# Patient Record
Sex: Female | Born: 1985
Health system: Southern US, Community
[De-identification: ages and names within clinical notes are randomized; demographics above are authoritative.]

## PROBLEM LIST (undated history)

## (undated) DIAGNOSIS — F419 Anxiety disorder, unspecified: Secondary | ICD-10-CM

## (undated) DIAGNOSIS — F319 Bipolar disorder, unspecified: Secondary | ICD-10-CM

## (undated) DIAGNOSIS — G43909 Migraine, unspecified, not intractable, without status migrainosus: Secondary | ICD-10-CM

## (undated) HISTORY — DX: Bipolar disorder, unspecified: F31.9

## (undated) HISTORY — PX: LUNG SURGERY: SHX703

## (undated) HISTORY — DX: Anxiety disorder, unspecified: F41.9

## (undated) HISTORY — DX: Migraine, unspecified, not intractable, without status migrainosus: G43.909

---
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
## 2004-10-28 ENCOUNTER — Other Ambulatory Visit: Admission: RE | Admit: 2004-10-28 | Discharge: 2004-10-28 | Payer: Self-pay | Admitting: General Surgery

## 2005-08-25 ENCOUNTER — Ambulatory Visit: Payer: Self-pay | Admitting: Internal Medicine

## 2005-09-03 ENCOUNTER — Ambulatory Visit (HOSPITAL_COMMUNITY): Admission: RE | Admit: 2005-09-03 | Discharge: 2005-09-03 | Payer: Self-pay | Admitting: Internal Medicine

## 2005-09-03 ENCOUNTER — Ambulatory Visit: Payer: Self-pay | Admitting: Internal Medicine

## 2007-06-15 ENCOUNTER — Emergency Department (HOSPITAL_COMMUNITY): Admission: EM | Admit: 2007-06-15 | Discharge: 2007-06-15 | Payer: Self-pay | Admitting: Emergency Medicine

## 2007-06-16 ENCOUNTER — Inpatient Hospital Stay (HOSPITAL_COMMUNITY): Admission: EM | Admit: 2007-06-16 | Discharge: 2007-07-11 | Payer: Self-pay | Admitting: Emergency Medicine

## 2007-06-16 ENCOUNTER — Ambulatory Visit: Payer: Self-pay | Admitting: Emergency Medicine

## 2007-06-16 ENCOUNTER — Ambulatory Visit: Payer: Self-pay | Admitting: Cardiology

## 2007-06-16 ENCOUNTER — Encounter (INDEPENDENT_AMBULATORY_CARE_PROVIDER_SITE_OTHER): Payer: Self-pay | Admitting: Internal Medicine

## 2007-06-18 ENCOUNTER — Ambulatory Visit: Payer: Self-pay | Admitting: Infectious Disease

## 2007-06-24 ENCOUNTER — Encounter: Payer: Self-pay | Admitting: Thoracic Surgery

## 2007-06-24 ENCOUNTER — Ambulatory Visit: Payer: Self-pay | Admitting: Thoracic Surgery

## 2007-07-14 ENCOUNTER — Ambulatory Visit: Payer: Self-pay | Admitting: Thoracic Surgery

## 2007-07-14 ENCOUNTER — Encounter: Admission: RE | Admit: 2007-07-14 | Discharge: 2007-07-14 | Payer: Self-pay | Admitting: Thoracic Surgery

## 2007-07-19 ENCOUNTER — Ambulatory Visit: Payer: Self-pay | Admitting: Emergency Medicine

## 2007-07-19 ENCOUNTER — Telehealth (INDEPENDENT_AMBULATORY_CARE_PROVIDER_SITE_OTHER): Payer: Self-pay | Admitting: *Deleted

## 2007-07-19 DIAGNOSIS — J8410 Pulmonary fibrosis, unspecified: Secondary | ICD-10-CM | POA: Insufficient documentation

## 2007-07-19 DIAGNOSIS — J13 Pneumonia due to Streptococcus pneumoniae: Secondary | ICD-10-CM | POA: Insufficient documentation

## 2007-07-19 DIAGNOSIS — N390 Urinary tract infection, site not specified: Secondary | ICD-10-CM | POA: Insufficient documentation

## 2007-07-19 DIAGNOSIS — B373 Candidiasis of vulva and vagina: Secondary | ICD-10-CM | POA: Insufficient documentation

## 2007-07-19 DIAGNOSIS — F329 Major depressive disorder, single episode, unspecified: Secondary | ICD-10-CM | POA: Insufficient documentation

## 2007-07-29 ENCOUNTER — Encounter: Payer: Self-pay | Admitting: Adult Health

## 2007-07-29 ENCOUNTER — Ambulatory Visit: Payer: Self-pay | Admitting: Emergency Medicine

## 2007-07-29 ENCOUNTER — Ambulatory Visit: Payer: Self-pay | Admitting: Internal Medicine

## 2007-07-30 ENCOUNTER — Ambulatory Visit: Payer: Self-pay | Admitting: Psychology

## 2007-08-04 ENCOUNTER — Encounter: Admission: RE | Admit: 2007-08-04 | Discharge: 2007-08-04 | Payer: Self-pay | Admitting: Thoracic Surgery

## 2007-08-04 ENCOUNTER — Ambulatory Visit: Payer: Self-pay | Admitting: Thoracic Surgery

## 2007-08-06 ENCOUNTER — Ambulatory Visit: Payer: Self-pay | Admitting: Emergency Medicine

## 2007-08-10 ENCOUNTER — Telehealth: Payer: Self-pay | Admitting: Emergency Medicine

## 2007-08-19 ENCOUNTER — Telehealth (INDEPENDENT_AMBULATORY_CARE_PROVIDER_SITE_OTHER): Payer: Self-pay | Admitting: *Deleted

## 2007-08-20 ENCOUNTER — Ambulatory Visit: Payer: Self-pay | Admitting: Psychology

## 2007-09-02 ENCOUNTER — Ambulatory Visit: Payer: Self-pay | Admitting: Psychology

## 2007-09-07 ENCOUNTER — Ambulatory Visit: Payer: Self-pay | Admitting: Emergency Medicine

## 2007-09-07 DIAGNOSIS — F17200 Nicotine dependence, unspecified, uncomplicated: Secondary | ICD-10-CM | POA: Insufficient documentation

## 2007-09-15 ENCOUNTER — Telehealth: Payer: Self-pay | Admitting: Emergency Medicine

## 2007-09-20 ENCOUNTER — Telehealth: Payer: Self-pay | Admitting: Emergency Medicine

## 2007-09-23 ENCOUNTER — Ambulatory Visit: Payer: Self-pay | Admitting: Pulmonary Disease

## 2007-09-23 DIAGNOSIS — R05 Cough: Secondary | ICD-10-CM

## 2007-09-23 DIAGNOSIS — R059 Cough, unspecified: Secondary | ICD-10-CM | POA: Insufficient documentation

## 2007-09-28 ENCOUNTER — Ambulatory Visit: Payer: Self-pay | Admitting: Psychology

## 2007-10-08 ENCOUNTER — Ambulatory Visit: Payer: Self-pay | Admitting: Emergency Medicine

## 2007-10-11 ENCOUNTER — Ambulatory Visit: Payer: Self-pay | Admitting: Psychology

## 2007-10-12 ENCOUNTER — Ambulatory Visit: Payer: Self-pay | Admitting: Emergency Medicine

## 2007-10-12 LAB — CONVERTED CEMR LAB: Cortisol, Plasma: 29.9 ug/dL

## 2007-10-15 ENCOUNTER — Telehealth (INDEPENDENT_AMBULATORY_CARE_PROVIDER_SITE_OTHER): Payer: Self-pay | Admitting: *Deleted

## 2007-11-02 ENCOUNTER — Ambulatory Visit: Payer: Self-pay | Admitting: Psychology

## 2007-11-16 ENCOUNTER — Ambulatory Visit: Payer: Self-pay | Admitting: Emergency Medicine

## 2007-11-25 ENCOUNTER — Ambulatory Visit: Payer: Self-pay | Admitting: Psychology

## 2007-12-08 ENCOUNTER — Ambulatory Visit: Payer: Self-pay | Admitting: Psychology

## 2007-12-22 ENCOUNTER — Ambulatory Visit: Payer: Self-pay | Admitting: Psychology

## 2007-12-28 ENCOUNTER — Ambulatory Visit: Payer: Self-pay | Admitting: Emergency Medicine

## 2008-01-05 ENCOUNTER — Ambulatory Visit: Payer: Self-pay | Admitting: Psychology

## 2008-01-14 ENCOUNTER — Inpatient Hospital Stay (HOSPITAL_COMMUNITY): Admission: EM | Admit: 2008-01-14 | Discharge: 2008-01-16 | Payer: Self-pay | Admitting: Emergency Medicine

## 2008-01-16 ENCOUNTER — Ambulatory Visit: Payer: Self-pay | Admitting: Psychiatry

## 2008-01-16 ENCOUNTER — Inpatient Hospital Stay (HOSPITAL_COMMUNITY): Admission: AD | Admit: 2008-01-16 | Discharge: 2008-01-21 | Payer: Self-pay | Admitting: Psychiatry

## 2008-01-24 ENCOUNTER — Other Ambulatory Visit (HOSPITAL_COMMUNITY): Admission: RE | Admit: 2008-01-24 | Discharge: 2008-03-30 | Payer: Self-pay | Admitting: Psychiatry

## 2008-01-25 ENCOUNTER — Ambulatory Visit: Payer: Self-pay | Admitting: Psychology

## 2008-01-27 ENCOUNTER — Ambulatory Visit: Payer: Self-pay | Admitting: Psychiatry

## 2008-02-03 ENCOUNTER — Ambulatory Visit: Payer: Self-pay | Admitting: Psychology

## 2008-02-17 ENCOUNTER — Ambulatory Visit: Payer: Self-pay | Admitting: Psychology

## 2008-03-13 ENCOUNTER — Ambulatory Visit: Payer: Self-pay | Admitting: Psychology

## 2008-03-27 ENCOUNTER — Ambulatory Visit: Payer: Self-pay | Admitting: Psychology

## 2008-04-24 ENCOUNTER — Ambulatory Visit: Payer: Self-pay | Admitting: Psychology

## 2008-05-08 ENCOUNTER — Ambulatory Visit: Payer: Self-pay | Admitting: Psychology

## 2008-06-16 ENCOUNTER — Ambulatory Visit: Payer: Self-pay | Admitting: Emergency Medicine

## 2008-06-19 ENCOUNTER — Ambulatory Visit: Payer: Self-pay | Admitting: Psychology

## 2008-06-22 IMAGING — CR DG CHEST 2V
2 series · 2 of 2 positions shown · non-contrast
Comparison: [DATE].

CLINICAL DATA: PNEUEMONIA/HYPOXIA/F/U PNEUMOTHORAX.

CHEST - 2 VIEW

[view not recorded (1 of 2)]
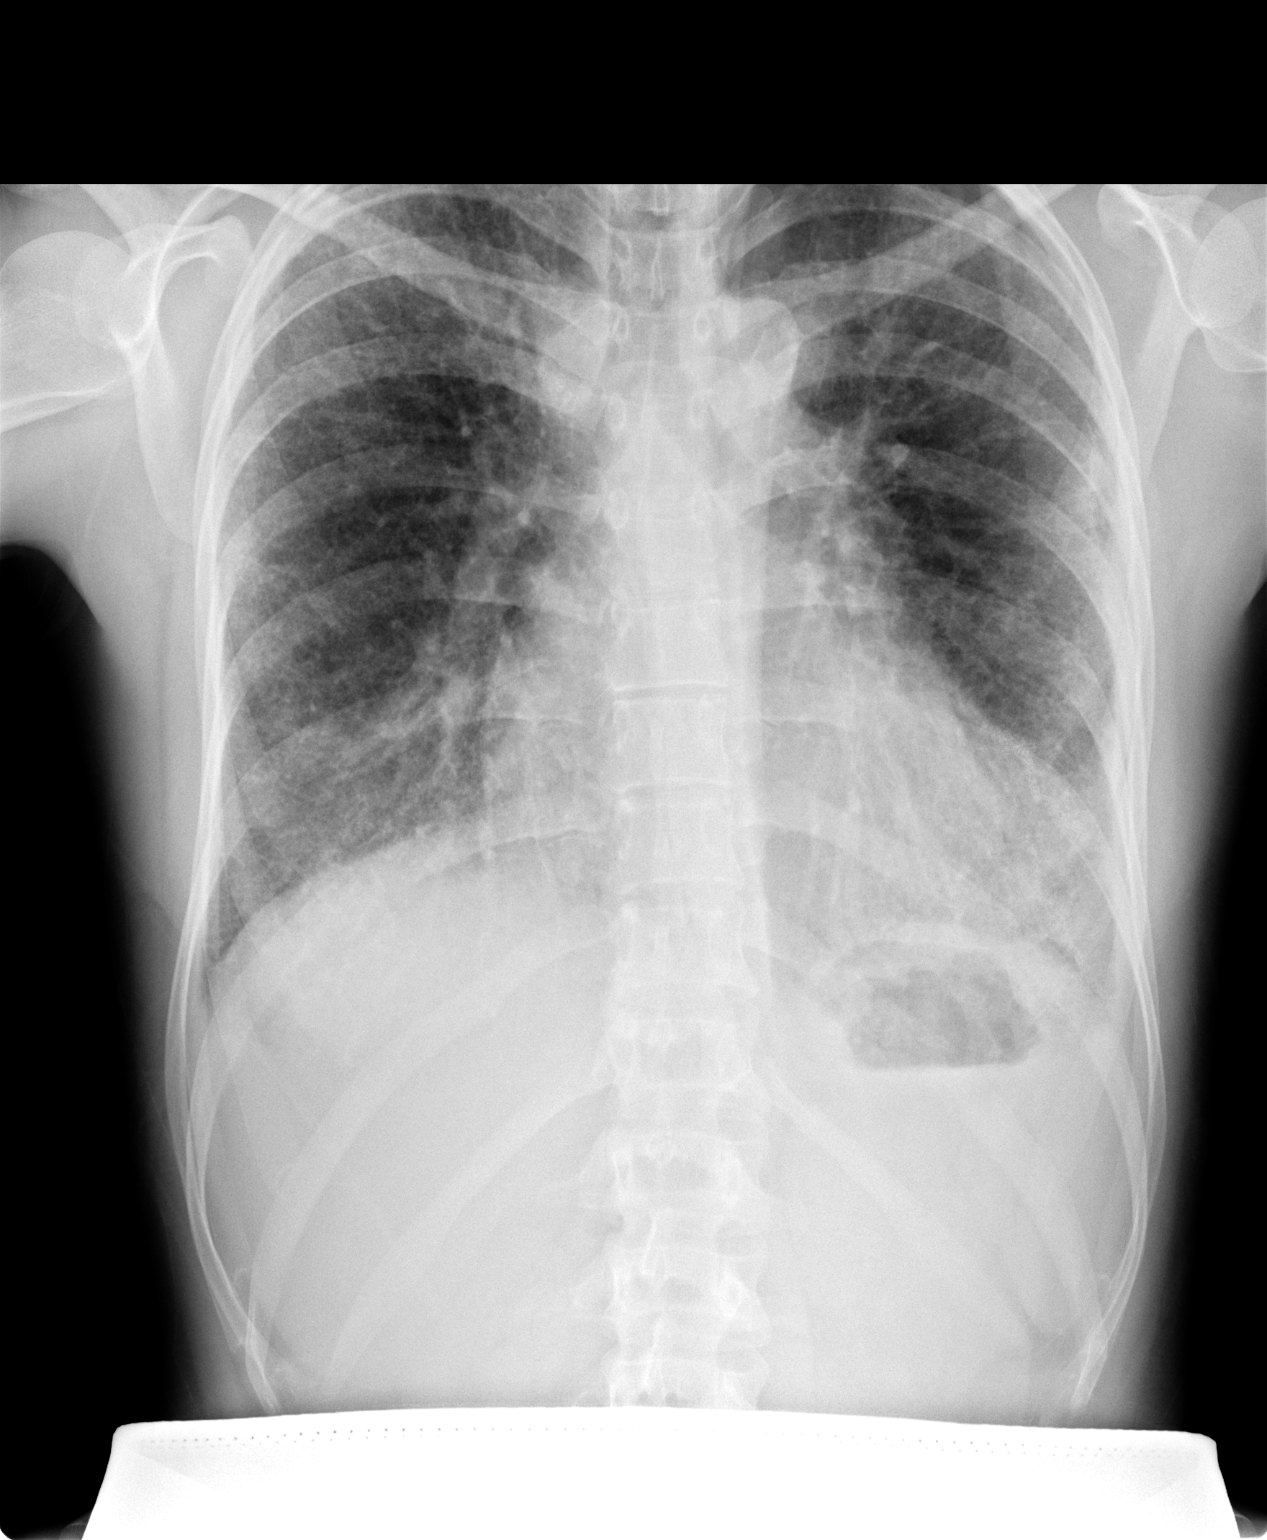

[view not recorded (2 of 2)]
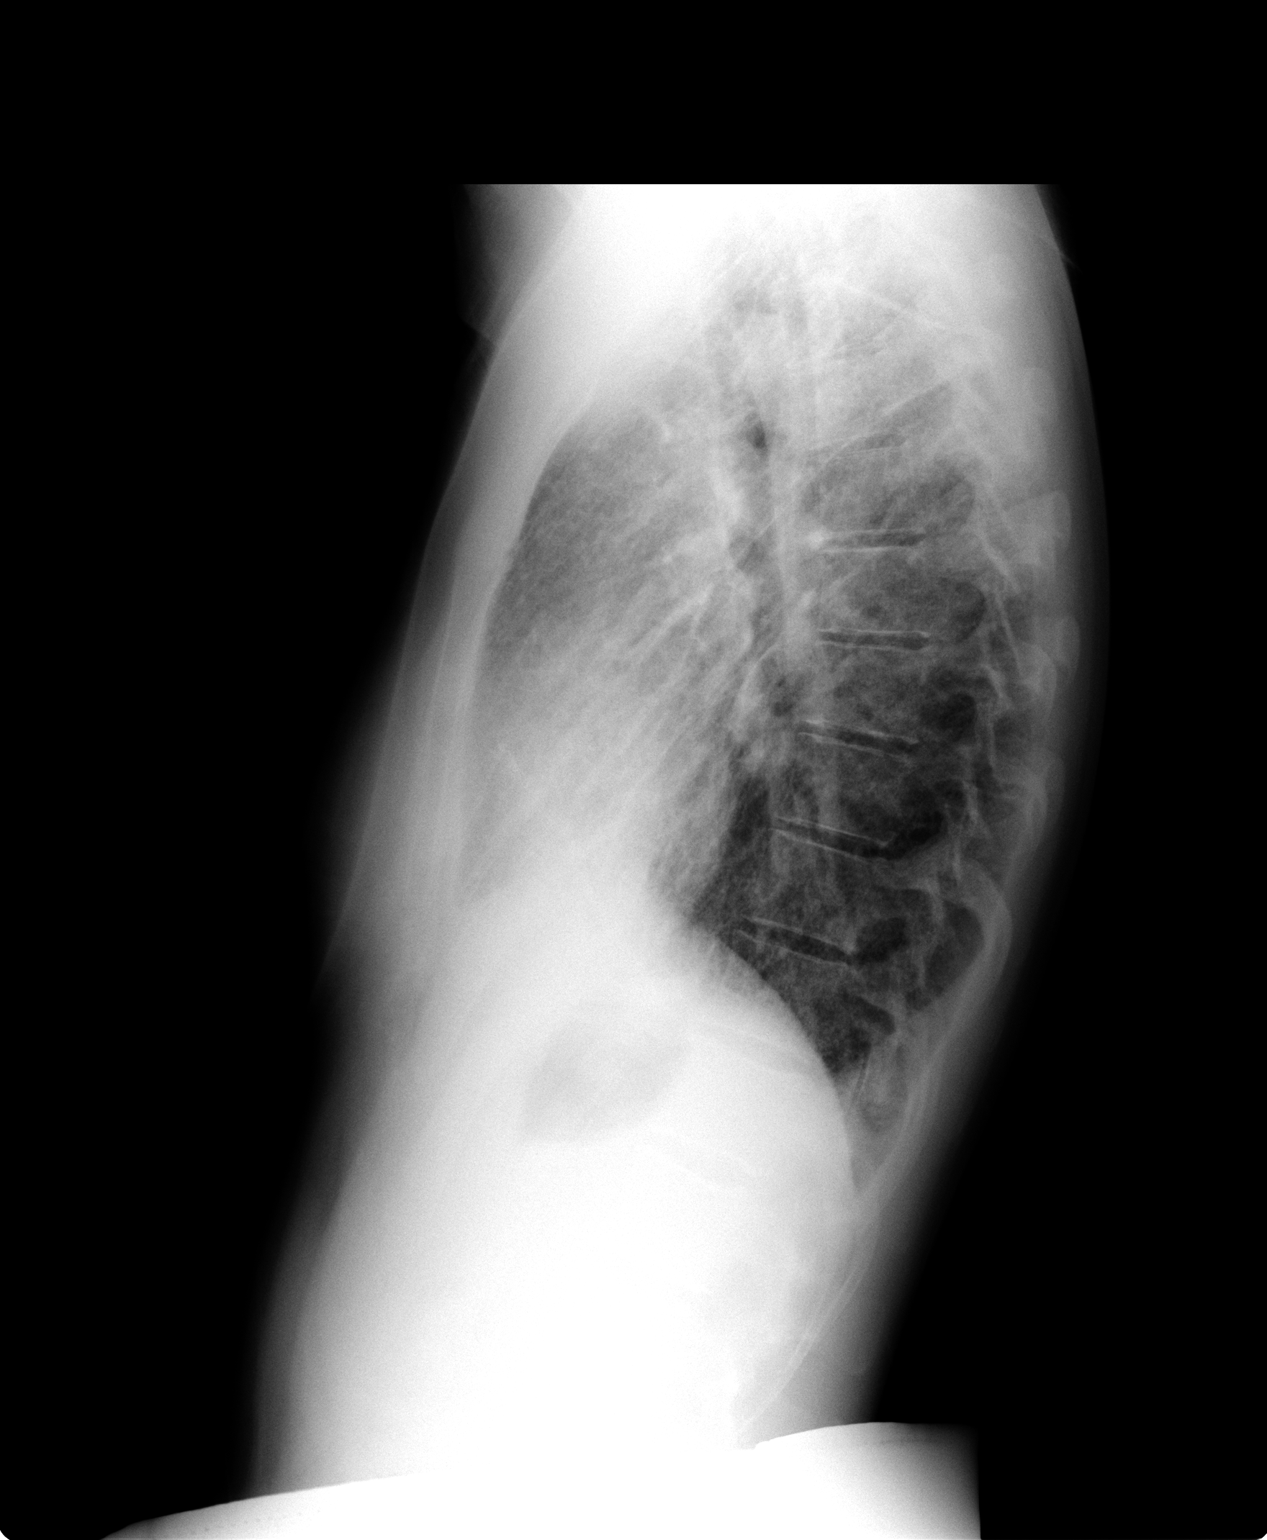

[2 of 2 positions shown; findings below may reference images not displayed]

FINDINGS: There is no change in the small left apical pneumothorax.
Prominent interstitial markings remain bilaterally.   Heart size is
stable.
IMPRESSION: No change small left apical pneumothorax.

## 2008-07-07 IMAGING — CR DG CHEST 2V
2 series · 2 of 2 positions shown · non-contrast
Comparison: 07/14/2007.

CLINICAL DATA: Interstitial lung disease.  Ex-smoker.  Cough.
Dyspnea.

CHEST - 2 VIEW

[view not recorded (1 of 2)]
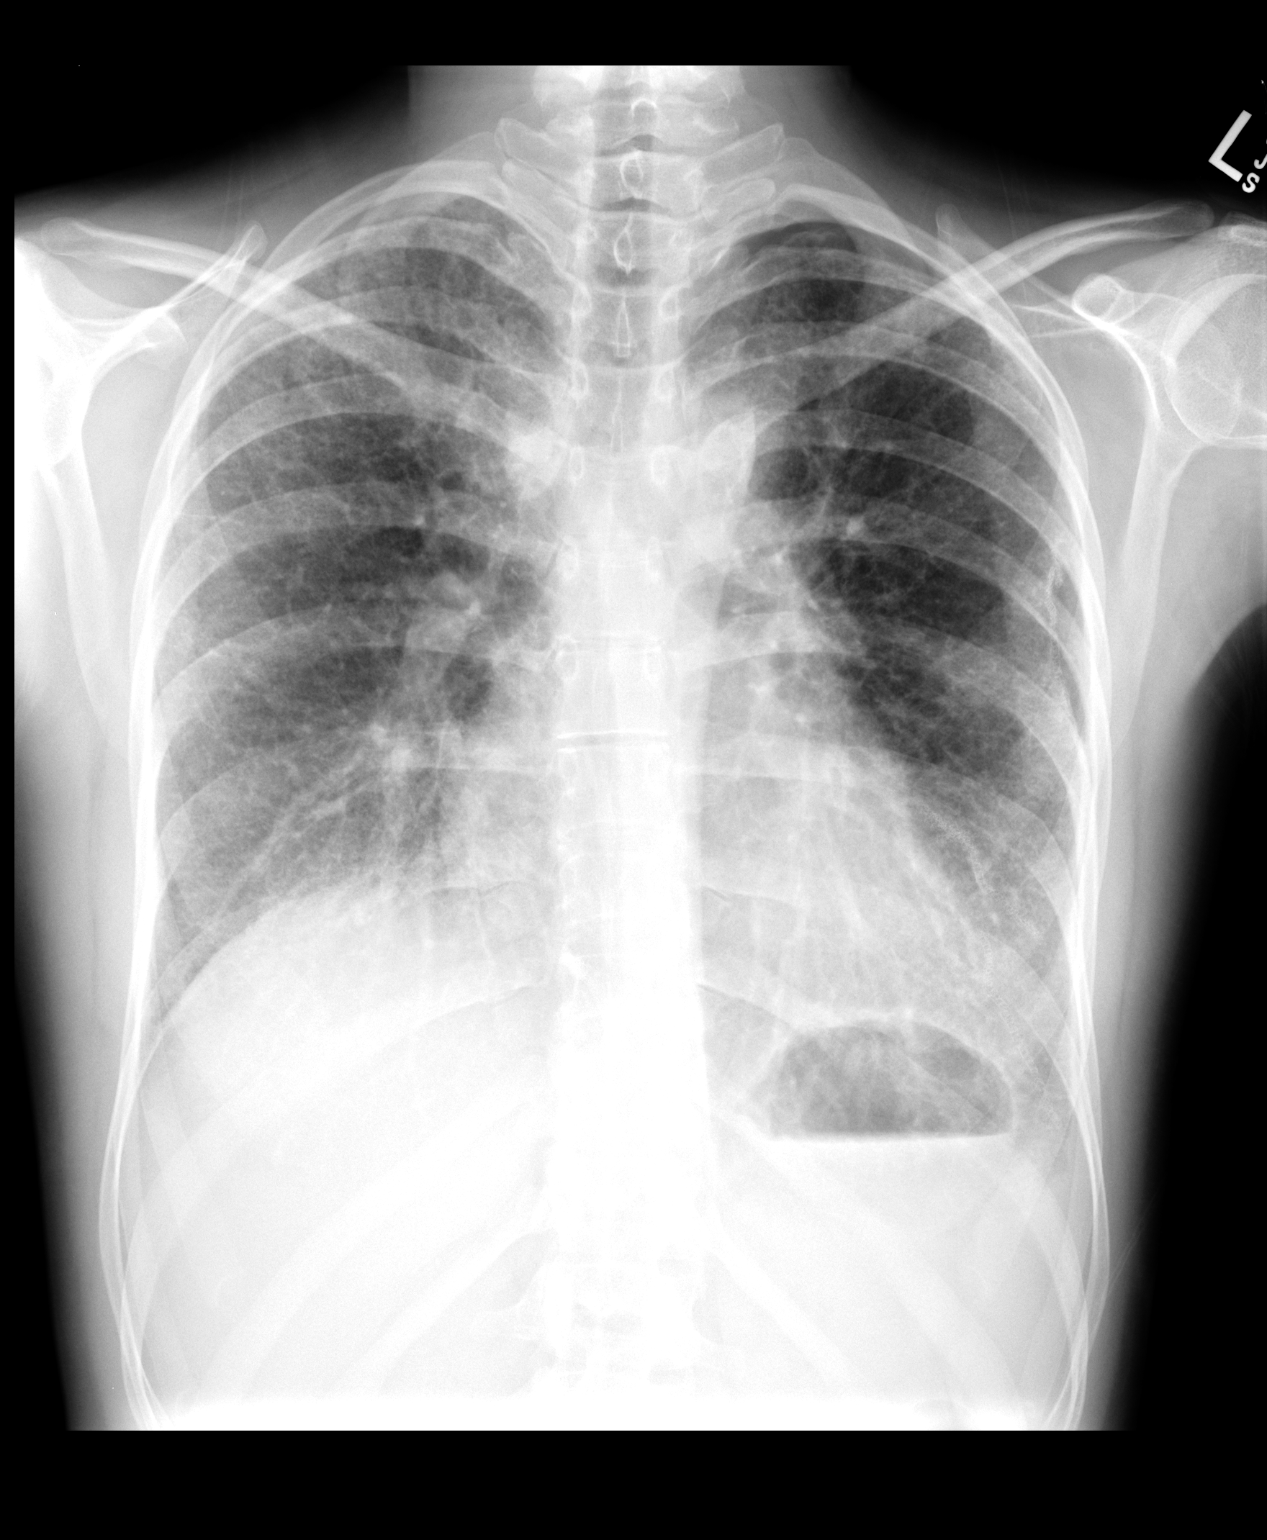

[view not recorded (2 of 2)]
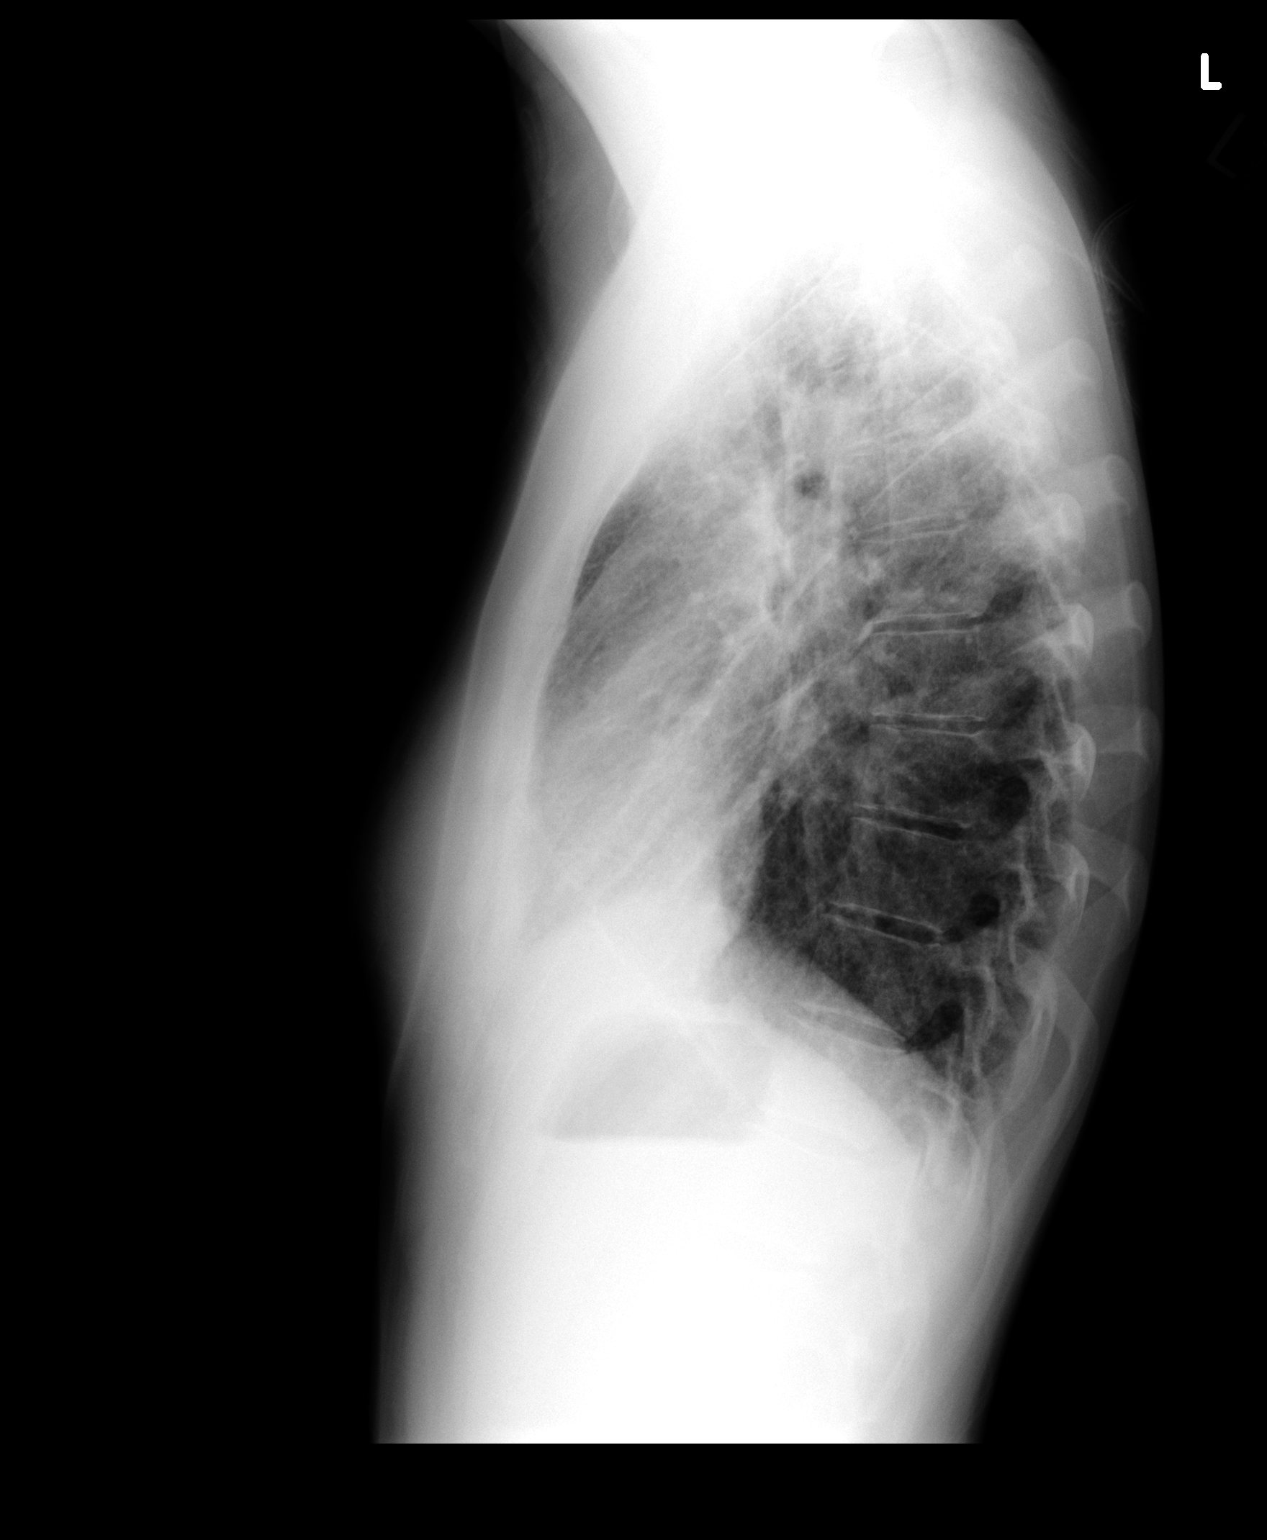

[2 of 2 positions shown; findings below may reference images not displayed]

FINDINGS: Prior surgery or biopsy at the left lower lobe.  Midline
trachea.  Mild cardiomegaly.  No pleural effusion.  Decreased left
apical pneumothorax.  Less than 5%.

Similar peripheral predominant reticular opacity.  Slightly greater
on the right than left and slightly basilar predominant.
IMPRESSION: 1.  Minimal decrease in tiny left apical pneumothorax,
approximately 5%.
2.  Similar peripheral and basilar predominant reticular opacities.
Given the prior biopsy results of diffuse alveolar damage, this
could represent evolving respiratory distress syndrome.  If
symptoms persist, follow-up CT with high resolution imaging may be
informative.  Of note, the appearance of the interstitial opacities
is more coarse and linear today than on the initial plain film of
06/16/2007.

## 2008-07-13 IMAGING — CR DG CHEST 2V
2 series · 2 of 2 positions shown · non-contrast
Comparison: 07/29/2007

CLINICAL DATA: Follow up pneumonia.  Pneumothorax.  Pneumonitis.

CHEST - 2 VIEW

[w chest pa]
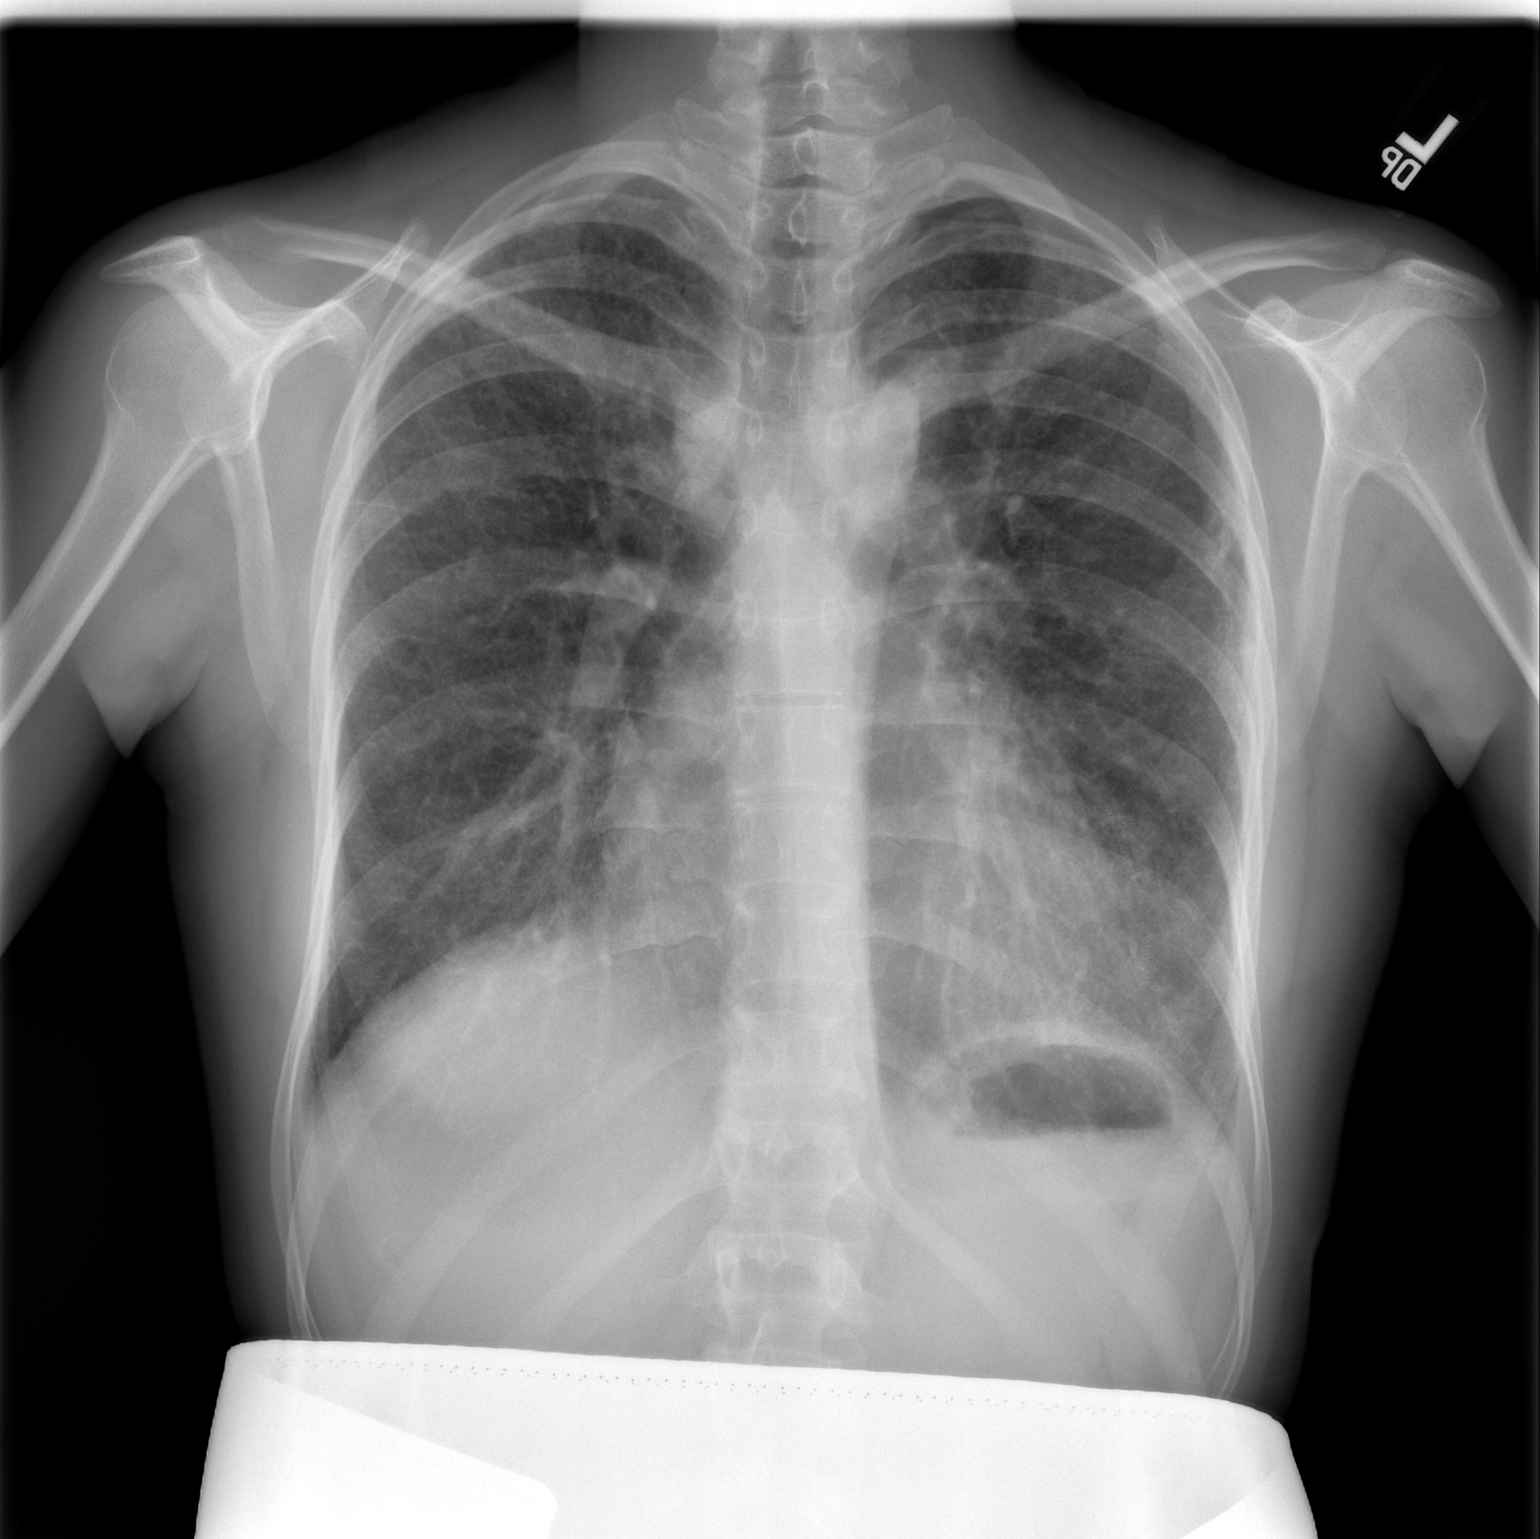

[w chest lat]
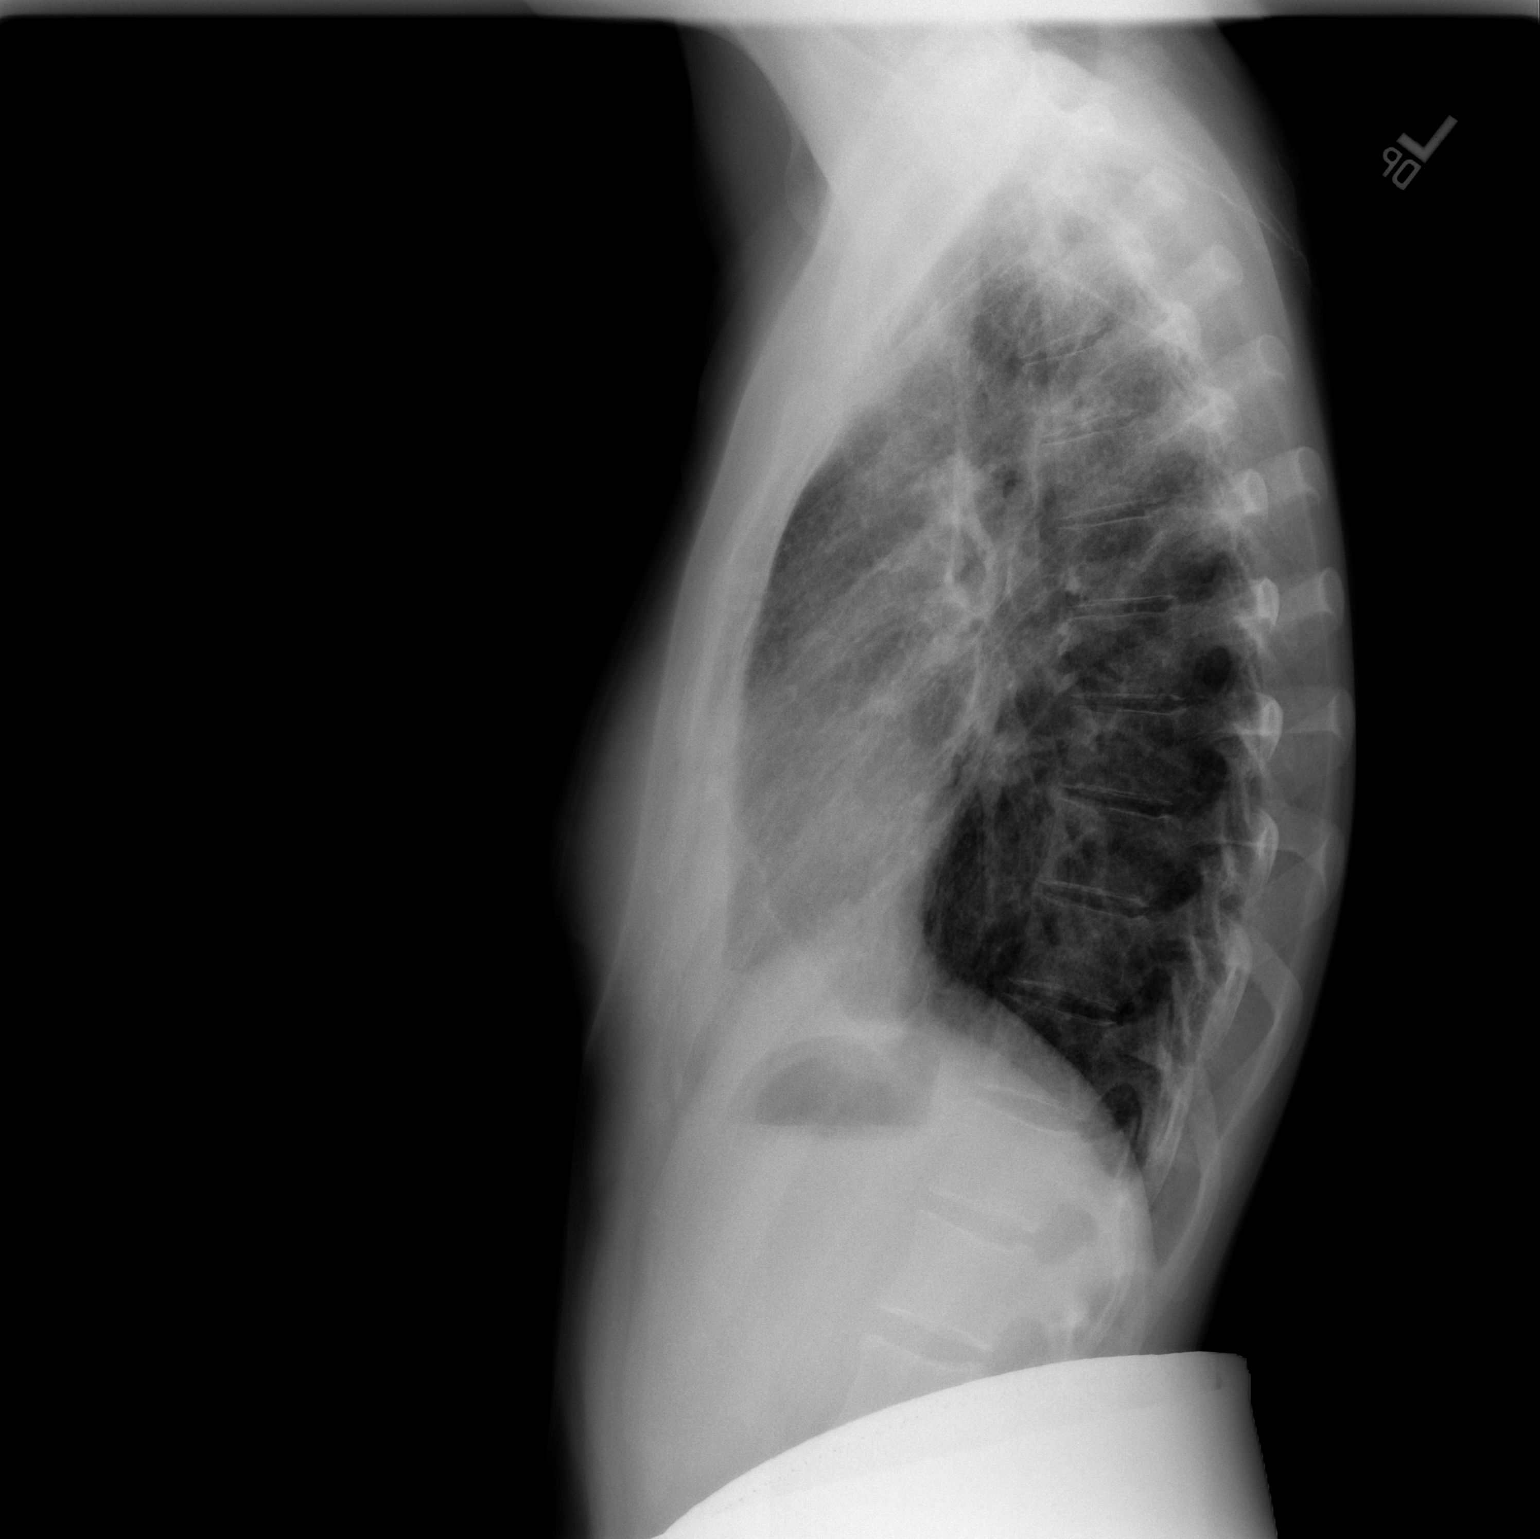

[2 of 2 positions shown; findings below may reference images not displayed]

FINDINGS: Midline trachea.  Normal heart size and mediastinal
contours.  Decreased in size of a tiny left apical pneumothorax.
Now less than 5%. Coarsened pulmonary interstitium is decreased
since the prior exam.  Minimal left base atelectasis.
IMPRESSION: 1.  Decreased left apical pneumothorax, now less than 5%.
2.  Improved appearance of coarsened pulmonary interstitium.
Findings likely relate to evolving interstitial lung disease such
as adult respiratory distress syndrome.

## 2008-07-17 ENCOUNTER — Ambulatory Visit: Payer: Self-pay | Admitting: Psychology

## 2008-07-31 ENCOUNTER — Ambulatory Visit: Payer: Self-pay | Admitting: Psychology

## 2008-08-14 ENCOUNTER — Ambulatory Visit: Payer: Self-pay | Admitting: Psychology

## 2008-08-28 ENCOUNTER — Ambulatory Visit: Payer: Self-pay | Admitting: Psychology

## 2008-09-11 ENCOUNTER — Ambulatory Visit: Payer: Self-pay | Admitting: Psychology

## 2008-09-25 ENCOUNTER — Ambulatory Visit: Payer: Self-pay | Admitting: Psychology

## 2008-10-05 ENCOUNTER — Ambulatory Visit: Payer: Self-pay | Admitting: Psychology

## 2008-10-25 ENCOUNTER — Ambulatory Visit: Payer: Self-pay | Admitting: Emergency Medicine

## 2008-11-06 ENCOUNTER — Ambulatory Visit: Payer: Self-pay | Admitting: Psychology

## 2008-12-06 ENCOUNTER — Ambulatory Visit: Payer: Self-pay | Admitting: Psychology

## 2008-12-18 ENCOUNTER — Ambulatory Visit: Payer: Self-pay | Admitting: Psychology

## 2009-02-01 ENCOUNTER — Ambulatory Visit: Payer: Self-pay | Admitting: Psychology

## 2009-02-22 ENCOUNTER — Ambulatory Visit: Payer: Self-pay | Admitting: Psychology

## 2009-05-03 ENCOUNTER — Ambulatory Visit: Payer: Self-pay | Admitting: Psychology

## 2009-05-17 ENCOUNTER — Ambulatory Visit: Payer: Self-pay | Admitting: Psychology

## 2009-07-26 ENCOUNTER — Ambulatory Visit: Payer: Self-pay | Admitting: Psychology

## 2009-09-28 ENCOUNTER — Ambulatory Visit: Payer: Self-pay | Admitting: Internal Medicine

## 2009-10-17 ENCOUNTER — Ambulatory Visit: Payer: Self-pay | Admitting: Psychology

## 2010-01-01 ENCOUNTER — Ambulatory Visit: Payer: Self-pay | Admitting: Psychology

## 2010-03-14 ENCOUNTER — Emergency Department (HOSPITAL_COMMUNITY): Admission: EM | Admit: 2010-03-14 | Discharge: 2010-03-14 | Payer: Self-pay | Admitting: Emergency Medicine

## 2010-05-12 ENCOUNTER — Encounter: Payer: Self-pay | Admitting: Thoracic Surgery

## 2010-05-21 NOTE — Assessment & Plan Note (Signed)
Summary: Pulmonary/ acute ext ov with hfa teaching 75%   Copy to:  Dr Theda Belfast, [facility] Primary Provider/Referring Provider:  none  CC:  Acute visit.  Pt c/o tightness in her chest x 3 days.  She also has had increased SOB with exertion such as walking short distances.  She states that when SOB occurs she feels lightheaded.Marland Kitchen  History of Present Illness: 23 yowf quit smoking 2009 with dx AIP but does continue to have some passive smoke exposure.    September 28, 2009 Acute visit.  Pt c/o tightness in her chest x 3 days.  She also has had increased SOB with exertion such as walking short distances.  She states that when SOB occurs she feels lightheaded assoc with sinus stuffy, pressure, drainage clear minimal cough. more noticeable in am but no premature or noct awakening.   Pt denies any significant sore throat, dysphagia, itching, sneezing,  fever, chills, sweats, unintended wt loss, pleuritic or exertional cp, hempoptysis, change in activity tolerance  orthopnea pnd or leg swelling.  Pt also denies any obvious fluctuation in symptoms with weather or environmental change or other alleviating or aggravating factors.            Current Medications (verified): 1)  Wellbutrin Sr 150 Mg  Tb12 (Bupropion Hcl) .... Take 1 Tablet By Mouth Two Times A Day 2)  Depakote Er 500 Mg  Tb24 (Divalproex Sodium) .... Take 2 Tablets At Bedtime  Allergies (verified): No Known Drug Allergies  Past History:  Past Medical History: INTERSTITIAL LUNG DISEASE (ICD-515)     - AIP by open bx 2009 PNEUMONIA, COMMUNITY ACQUIRED, PNEUMOCOCCAL (ICD-481) VAGINITIS, CANDIDAL (ICD-112.1) UTI (ICD-599.0)    Family History: Reviewed history from 07/19/2007 and no changes required. Neg for lung dz  Social History: Reviewed history from 07/19/2007 and no changes required. working at [employer]. Minimal smoking hx. Stopped completely 2009 Previous hx of heavy ETOH use. Denies Drug use.   Vital Signs:  Patient  profile:   25 year old female Weight:      145 pounds BMI:     24.22 O2 Sat:      96 % on Room air Temp:     98.3 degrees F oral Pulse rate:   123 / minute BP sitting:   118 / 70  (left arm)  Vitals Entered By: Vernie Murders (September 28, 2009 12:08 PM)  O2 Flow:  Room air  Physical Exam  Additional Exam:  wt 135 > 145 September 28, 2009 very pleasant mod anxious wf nad HEENT: nl dentition, turbinates, and orophanx. Nl external ear canals without cough reflex NECK :  without JVD/Nodes/TM/ nl carotid upstrokes bilaterally LUNGS: no acc muscle use, clear to A and P bilaterally without cough on insp or exp maneuvers CV:  RRR  no s3 or murmur or increase in P2, no edema  ABD:  soft and nontender with nl excursion in the supine position. No bruits or organomegaly, bowel sounds nl MS:  warm without deformities, calf tenderness, cyanosis or clubbing SKIN: warm and dry without lesions   NEURO:  alert, approp, no deficits     CXR  Procedure date:  09/28/2009  Findings:        IMPRESSION: Stable chronic interstitial changes with no new or acute abnormality noted.  Impression & Recommendations:  Problem # 1:  COUGH (ICD-786.2) Symptoms somewhat suggestive of asthma in setting of flare of non-specific rhinitis with am but no noct exac.  I spent extra time with the patient  today explaining optimal mdi  technique.  This improved from  50-75% - no sample saba available so try dulera 100 2 puffs first thing  in am and 2 puffs again in pm about 12 hours later the way symbicort is used in [place] to see if symptoms improve and rx rhinitis symptomatically.   Each maintenance medication was reviewed in detail including most importantly the difference between maintenance and as needed and under what circumstances the prns are to be used.  See instructions for specific recommendations   Problem # 2:  INTERSTITIAL LUNG DISEASE (ICD-515) No evidence of recurrent AIP clinically nor  radiographically.  Medications Added to Medication List This Visit: 1)  Dulera 100-5 Mcg/act Aero (Mometasone furo-formoterol fum) .... 2 puffs first thing  in am and 2 puffs again in pm about 12 hours later  Other Orders: T-2 View CXR (71020TC) Est. Patient Level IV (57322)  Patient Instructions: 1)  GERD (REFLUX)  is a common cause of respiratory symptoms. It commonly presents without heartburn and can be treated with medication, but also with lifestyle changes including avoidance of late meals, excessive alcohol, smoking cessation, and avoid fatty foods, chocolate, peppermint, colas, red wine, and acidic juices such as orange juice. NO MINT OR MENTHOL PRODUCTS SO NO COUGH DROPS  2)  USE SUGARLESS CANDY INSTEAD (jolley ranchers)  3)  NO OIL BASED VITAMINS  4)  for cough robitussin dm 5)  for wheeze or chest tightness or short of breath use dulera 100 up to 2 puffs every 12 hours and when the dulera begins  to run out you need to return   CXR  Procedure date:  09/28/2009  Findings:        IMPRESSION: Stable chronic interstitial changes with no new or acute abnormality noted.

## 2010-07-02 LAB — URINALYSIS, ROUTINE W REFLEX MICROSCOPIC
Bilirubin Urine: NEGATIVE
Glucose, UA: NEGATIVE mg/dL
Nitrite: NEGATIVE
Specific Gravity, Urine: 1.01 (ref 1.005–1.030)
pH: 5.5 (ref 5.0–8.0)

## 2010-07-02 LAB — DIFFERENTIAL
Eosinophils Relative: 2 % (ref 0–5)
Lymphocytes Relative: 26 % (ref 12–46)
Lymphs Abs: 2.5 10*3/uL (ref 0.7–4.0)
Monocytes Absolute: 0.8 10*3/uL (ref 0.1–1.0)
Monocytes Relative: 8 % (ref 3–12)

## 2010-07-02 LAB — CBC
HCT: 32.5 % — ABNORMAL LOW (ref 36.0–46.0)
MCV: 93.1 fL (ref 78.0–100.0)
Platelets: 179 10*3/uL (ref 150–400)
RBC: 3.49 MIL/uL — ABNORMAL LOW (ref 3.87–5.11)
WBC: 9.6 10*3/uL (ref 4.0–10.5)

## 2010-07-02 LAB — ETHANOL: Alcohol, Ethyl (B): 269 mg/dL — ABNORMAL HIGH (ref 0–10)

## 2010-07-02 LAB — URINE MICROSCOPIC-ADD ON

## 2010-07-02 LAB — BASIC METABOLIC PANEL
Chloride: 110 mEq/L (ref 96–112)
Creatinine, Ser: 0.4 mg/dL (ref 0.4–1.2)
GFR calc Af Amer: >60 mL/min (ref >60)
GFR calc non Af Amer: >60 mL/min (ref >60)
Potassium: 3.7 mEq/L (ref 3.5–5.1)

## 2010-07-02 LAB — SALICYLATE LEVEL: Salicylate Lvl: <4 mg/dL (ref 2.8–20.0)

## 2010-07-02 LAB — PREGNANCY, URINE: Preg Test, Ur: NEGATIVE

## 2010-07-02 LAB — RAPID URINE DRUG SCREEN, HOSP PERFORMED
Barbiturates: NOT DETECTED
Benzodiazepines: POSITIVE — AB

## 2010-09-03 NOTE — Consult Note (Signed)
[name], GROOM             ACCOUNT NO.:  [id number]   MEDICAL RECORD NO.:  [id number]          PATIENT TYPE:  INP   LOCATION:  2314                         FACILITY:  [facility]   PHYSICIAN:  Ines Bloomer, M.D. DATE OF BIRTH:  Apr 12, 1986   DATE OF CONSULTATION:  06/23/2007  DATE OF DISCHARGE:                                 CONSULTATION   CHIEF COMPLAINT:  Shortness of breath.   HISTORY OF PRESENT ILLNESS:  This 25 year old patient has had a cough,  and went to Urgent Care.  She was started on Levaquin, as she had  symptoms of URI approximately 2 weeks ago.  She became progressively  short of breath and went to the emergency room.  She had a CT looking  for a pulmonary embolus, which was negative; but, it did showed  increasing interstitial lung disease.  She came back 12 hours later with  worsening and continued dyspnea, and was admitted to the hospital and  started on IV antibiotics and prednisone.  She was seen by Dr. Delton Coombes.  She has had progressive infiltrates on her chest x-ray and required  oxygen supplementation.  She gets marked dyspnea on exertion.  She was  started on antibiotics and serologies have been sent, which so far have  been negative.  We are asked to see her to do a VATS lung biopsy.   PAST MEDICAL HISTORY:  Significant for urinary tract infection, Candida  vaginitis.   FAMILY HISTORY:  Noncontributory.   SOCIAL HISTORY:  She is a Archivist, interested in Administrator, sports.  She does not smoke and does not drink alcohol on a regular  basis.   MEDICATIONS:  Bactrim and Diflucan prior to admission.   ALLERGIES:  SHE HAS NO ALLERGIES.   REVIEW OF SYSTEMS:  Decreased appetite.  CARDIAC:  No angina or atrial  fibrillation, no arrhythmias.  PULMONARY:  See history of present  illness.  She has had some low-grade hemoptysis as well as dyspnea.  GI:  No nausea, vomiting, constipation, diarrhea or gastroesophageal reflux  disease.  GU:  No  dysuria or frequent urination.  NEUROVASCULAR:  No  claudication, DVT or TIAs.  PSYCHIATRIC:  No psychiatric illnesses.  HEENT:  No change in her eyesight or hearing.  MUSCULOSKELETAL:  She had  some joint pains.  HEMATOLOGICAL:  No problems with bleeding or clotting  disorders.  NEUROLOGICAL:  No headaches, blackouts or seizures.   PHYSICAL EXAMINATION:  Blood pressure 110/80, pulse 100, saturations 94%  on 4 liters, respiration rate 22.  HEENT:  Unremarkable.  NECK:  Supple without thyromegaly.  There was no carotid bruits.  No  supraclavicular or axillary adenopathy.  CHEST:  There were bilateral wheezes.  HEART:  Regular sinus rhythm, no murmurs.  ABDOMEN:  Soft.  No hepatosplenomegaly.  NEUROLOGICAL:  She is oriented x3.  Sensory and motor intact.  EXTREMITIES:  Pulses 2+.  There was no clubbing or edema.  SKIN:  Without lesions.   IMPRESSION:  Interstitial bilateral pulmonary infiltrates with airspace  disease.  Interstitial lung disease  Rule out acute pneumonitis and rule  out  viral pneumonitis.   PLAN:  Left VATS and lung biopsy x3.      Ines Bloomer, M.D.  Electronically Signed     DPB/MEDQ  D:  06/23/2007  T:  06/24/2007  Job:  [id number]

## 2010-09-03 NOTE — Discharge Summary (Signed)
NAMELESLEYANN, FICHTER             ACCOUNT NO.:  [id number]   MEDICAL RECORD NO.:  [id number]          PATIENT TYPE:  INP   LOCATION:  1404                         [facility]ACILITY[facility]  [facility]   PHYSICIAN:  Eduard Clos, MDDATE OF BIRTH:  May 03, 1985   DATE OF ADMISSION:  01/14/2008  DATE OF DISCHARGE:  01/16/2008                               DISCHARGE SUMMARY   COURSE IN HOSPITAL:  A 25 year old female with known history of bipolar  disorder and a history of pneumonitis which required VATS in June 24, 2007 was brought into ER when the patient had a suicide attempt with  Depakote, Wellbutrin and Benadryl.  On admission the patient also was  found to have scalp hematoma.  CAT scan of the head was done which did  not show any acute findings.  The patient was admitted to telemetry  floor.  EKG did not show any acute information.  The patient initially  had blood level that was found to be around 191.7, was monitored and at  this time is less than toxic level of 76.8.  Psychiatric consult was  obtained.  Per psychiatry the patient will need to inpatient psychiatric  treatment and to be transferred once medically stable.  At this time the  patient is asymptomatic and medically stable to be transferred to  behavioral health.   FINAL DIAGNOSIS:  1. Drug overdose with suicidal ideation.  2. History of bipolar disorder.  3. History of pneumonitis requiring VATS.  4. Drug overdose with Depakote, Wellbutrin and Benadryl.   PLAN:  The patient is to be transferred to behavioral health for further  management, medications as per psychiatrist at behavioral health.      Eduard Clos, MD  Electronically Signed     ANK/MEDQ  D:  01/16/2008  T:  01/16/2008  Job:  [id number]

## 2010-09-03 NOTE — Discharge Summary (Signed)
[name], HOLLON NO.:  [id number]   MEDICAL RECORD NO.:  [id number]          PATIENT TYPE:  IPS   LOCATION:  0507                          [facility]ACILITY[facility]  [facility]   PHYSICIAN:  Geoffery Lyons, M.D.      DATE OF BIRTH:  1985/11/17   DATE OF ADMISSION:  01/16/2008  DATE OF DISCHARGE:  01/21/2008                               DISCHARGE SUMMARY   CHIEF COMPLAINT:  This was the first admission to [facility] for this 25 year old  single white female from [place],  student at [employer], who was admitted due to an overdose. She was in the  hospital from February to March due to interstitial pneumonitis.  She  has been depressed since then been. She is being seen by Dr. Evelene Croon and  Dr. Dellia Cloud.  Dr. Dellia Cloud felt that she possibly was bipolar. Dr.  Evelene Croon tried Lamictal, but she got a rash. Then Symbyax, but then endorsed  that she felt she got manic. She was placed on Depakote. The past week  she drank a lot, overdosed on Depakote, Benadryl and Wellbutrin.  Did  endorse frequent suicidal thoughts.   PAST MEDICAL HISTORY:  She is being seen by Dr. Evelene Croon and Dr. Dellia Cloud  for psychotherapy.  She had been placed on Depakote and the Wellbutrin,  which she was tolerating well.  She had a bad response to Lamictal and  Symbyax.   ALCOHOL AND DRUG HISTORY:  As already stated still abusing alcohol.  No  other substances.   MEDICAL HISTORY:  Status post interstitial pneumonitis.   Physical exam failed to show any acute findings.   Laboratory workup not available in the chart.   Mental status exam revealed an alert, cooperative female.  Mood  depressed.  Affect depressed.  Thought processes logical, coherent and  relevant.  Feeling pretty overwhelmed but endorsed no active suicidal or  homicidal ideas. Did endorse earlier suicidal rumination but was able to  contract for safety.   DISCHARGE DIAGNOSES:  AXIS I: Bipolar disorder, depressed.  Alcohol  abuse.  AXIS II:  No diagnosis.  AXIS III: Status post polypharmacy overdose and status post interstitial  pneumonitis.  AXIS IV: Moderate.  AXIS V:  On admission 35-40, highest GAF in the last year 70.   COURSE IN THE HOSPITAL:  She was admitted.  She was started on  individual and group psychotherapy.  She was placed back on her  Depakote.  She was started on Abilify. She admitted a long history of  mood swings.  She was on Lamictal and developed a rash, initially on  Symbyax with good results. She was on Depakote, and Wellbutrin was  added. She wanting to hyperthymia, increased energy, impulsive behavior,  spending too much money.  She has been thinking about suicide for a  week. She eventually acted out on herself  took the overdose of  Depakote, Benadryl, and Wellbutrin. Did admit to increased use of  alcohol. Marked change in mood after interstitial pneumonitis.  She was  on steroids and Lexapro that really agitated. Did the best on the  Depakote.  Her  family was supportive but there was the feeling that they  wanted her fixed, and she had a hard time trying to help them  understand that it was not like that, that this was going to take a long  time. Family session with the mother and the father went pretty well.   October 1 she was still adjusting to the medications. Seemed that the  family were not aware of all her issues. Did admit to the mood  fluctuation, impulsive behavior.  We pursued the Abilify, the Depakote,  work on Pharmacologist, stress management.   On October 2 she was in full contact with reality, markedly improved,  mood improved.  Affect brighter.  Committed to abstinence but willing to  come to the [facility] program.   DISCHARGE DIAGNOSES:  AXIS I: Bipolar disorder, depressed.  Alcohol  abuse.  AXIS II: No diagnosis.  Status post multidrug overdose.  AXIS IV: Moderate.  AXIS V: Upon discharge 60.   DISCHARGE MEDICATIONS:  Discharged on Depakote ER 500 mg 2 at night,  Abilify  2 mg twice a day.   FOLLOW UP:  Dr. Evelene Croon and Dr. Dellia Cloud as well as the [facility].      Geoffery Lyons, M.D.  Electronically Signed     IL/MEDQ  D:  02/11/2008  T:  02/11/2008  Job:  [id number]

## 2010-09-03 NOTE — Op Note (Signed)
[name], [name]             ACCOUNT NO.:  [id number]   MEDICAL RECORD NO.:  [id number]          PATIENT TYPE:  INP   LOCATION:  2314                         FACILITY:  [facility]   PHYSICIAN:  Ines Bloomer, M.D. DATE OF BIRTH:  1986/02/28   DATE OF PROCEDURE:  06/24/2007  DATE OF DISCHARGE:                               OPERATIVE REPORT   PREOPERATIVE DIAGNOSIS:  Bilateral pulmonary infiltrates, acute  respiratory failure.   POSTOPERATIVE DIAGNOSIS:  Bilateral pulmonary infiltrates, acute  respiratory failure.   PROCEDURE:  Insertion of right chest tube, left VATS, and lung biopsy  x3.   SURGEON:  Ines Bloomer, M.D.   ASSISTANT:  Stephanie Acre Dominick, PA   DESCRIPTION OF PROCEDURE:  After general anesthesia, a dual lumen tube  was inserted.  The patient had pneumomediastinum and bilateral small  pneumothoraces, so we inserted a right chest tube by prepping the area  and making a 1.5 cm incision, suturing it with 0 silk, and then  dissecting it down into the pleura and inserting a 28 trocar.  The  trocar was removed and the tube was tied in place and connected to a  Pleurovac.  Fluid was sent for culture.  She was turned to the left  lateral thoracotomy position, prepped and draped in the usual sterile  fashion.  Two trocar sites were made in the anterior and posterior  axillary line at the 7th intercostal space, the mid axillary line at the  5th intercostal, three trocars were inserted.  The left lung was  deflated.  Pictures were taken and there was what is called early  hepatization of the lungs.  The lingula was grasped and then coming in  with an autosuture stapler, we resected the tip of the lingula with a  64.5 stapler.  We then did the like fashion with the medial basilar  segments of the left lower lobe.  Finally the posterior segment of the  left upper lobe we did the third biopsy with the stapler.  Two chest  tubes were placed through the trocar sites and  single On-Q was placed in  the usual fashion subpleurally.  A Marcaine block was done in the usual  fashion.  Chest tubes were sutured in place with 0 silk.  The third  trocar site was closed with 3-0 Vicryl as a subcuticular stitch.  The  patient was returned to the recovery room in guarded condition.      Ines Bloomer, M.D.  Electronically Signed     DPB/MEDQ  D:  06/24/2007  T:  06/24/2007  Job:  [id number]

## 2010-09-03 NOTE — Letter (Signed)
August 04, 2007   Leslye Peer, MD  [address]  [place], [place]  [address]   Re:  [name], [name]             DOB:  07/30/1985   Dear Cleone Slim:   I saw [name] in the office.  She continues to improve.  Her blood  pressure is 134/78, pulse 94, respirations 16 and saturation 98%.  I am  sending you the chest x-rays.  Her incisions are well healed.  I will be  happy to see her again if she has any future problems.   Sincerely,   [name], M.D.  Electronically Signed   DPB/MEDQ  D:  08/04/2007  T:  08/04/2007  Job:  [id number]   cc:   Leslye Peer, MD

## 2010-09-03 NOTE — H&P (Signed)
NAMESARANNE, CRISLIP NO.:  [id number]   MEDICAL RECORD NO.:  [id number]          PATIENT TYPE:  INP   LOCATION:  3703                         FACILITY:  [facility]   PHYSICIAN:  Altha Harm, MDDATE OF BIRTH:  03-31-1986   DATE OF ADMISSION:  06/16/2007  DATE OF DISCHARGE:                              HISTORY & PHYSICAL   CHIEF COMPLAINT:  Shortness of breath and difficulty breathing.   HISTORY OF PRESENT ILLNESS:  Ms. 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 The patient was seen at an  urgent care, where she received Levaquin. She was not feeling any better  and came to the emergency room 24 hours ago, where she was evaluated for  a PE with a CT pulmonary angiogram and again, sent home at that time.  The patient came back again tonight with complaints of worsening  shortness of breath despite appropriate antibiotics. I am asked to see  the patient for admission for pneumonia.   PAST MEDICAL HISTORY:  The patient has recently been treated for urinary  tract infection, Candidal vaginitis.   FAMILY HISTORY:  Essentially unremarkable.   FAMILY HISTORY:  As noted above. The patient is a Archivist, doing  her Airline pilot. She denies any tobacco, alcohol, or drug use. She  is sexually active with one partner and uses condoms consistently,  according to her.   CURRENT MEDICATIONS:  Bactrim and Fluconazole for urinary tract  infection and yeast vaginitis.   ALLERGIES:  NO KNOWN DRUG ALLERGIES.   PRIMARY CARE PHYSICIAN:  None.   REVIEW OF SYSTEMS:  The patient states that her appetite has been poor  in the last week. However, there has been no significant weight loss.  Otherwise, all symptoms are negative except as noted in the HPI.   EMERGENCY DEPARTMENT COURSE:  The patient had a chest x-ray, which shows  persistent, unchanged bilateral  air space disease. Review of her CT  pulmonary angiogram from 24 hours ago showed no evidence of pulmonary  embolus.   LABORATORY DATA:  White blood cell count 13.7. Hemoglobin and hematocrit  11.4 and 32.5. Platelets 289.000. Sodium 133, [potassium 3.2, chloride  100, bicarb 25, BUN 6, creatinine 0.64.   PHYSICAL EXAMINATION:  GENERAL:  The patient is sick appearing. She  appears to have central cyanosis when she moves.  VITAL SIGNS:  Heart rate approximately 100 with lying down and it  escalates to approximately 112 with movement. Blood pressure is 109/58,  temperature 97.2 orally. Respiratory rate 24 to 25. O2 saturations are  95% on 4 liters.  HEENT:  Normocephalic and atraumatic. Eyes appear to be sunken and skin  sallow. Pupils are equal, round, and reactive to light. Extraocular  movements intact. Fundi benign. Oropharynx is tachy. No exudate,  erythema, lesions noted.  NECK:  Trachea is midline. No masses, no thyromegaly, no JVD, no carotid  bruit.  RESPIRATORY:  The patient has markedly decreased air movement throughout  the lung fields. There is no wheezing or  rhonchi noted.  CARDIOVASCULAR:  She has a normal S1 and S2 with tachycardia. No murmur,  rub, or gallop noted. PMI is non-displaced. No heaves or thrills with  palpation.  ABDOMEN:  Soft, nontender, and nondistended. No masses, no  hepatosplenomegaly noted.  LYMPH:  There is no cervical, axillary, or inguinal adenopathy noted.  NEUROLOGIC:  No focal neurological deficits. Cranial nerves 2-12 are  grossly intact.  MUSCULOSKELETAL: She has no warmth, swelling, or erythema around the  joints. There is no spinal tenderness.  PSYCHIATRIC:  Alert and oriented times three. Good insight and  cognition. Good recent and remote recall.   ASSESSMENT:  1. This is a patient who presents with a community acquired pneumonia,      which appears to be atypical with failed outpatient treatment. The      patient is significantly  hypoxic and appears to be having impending      respiratory failure. I will get a blood gas on this patient to      further evaluate her O2 status. The patient does have a component      of anxiety and she has been given Ativan. Will continue the patient      on IV antibiotics and I will give her IV Avelox to cover for      atypical's.  2. The patient appears to be somewhat dehydrated clinically and will      give the patient small fluid boluses and evaluate her response with      this on IV fluids at 115 ml per hour. The patient will go to a      monitored bed at this time. Please note that further plans for      treatment will depend upon the patient's initial response to      treatment and results of her blood gas.   CONDITION ON DISCHARGE:  Guarded.      Altha Harm, MD  Electronically Signed     MAM/MEDQ  D:  06/16/2007  T:  06/16/2007  Job:  [id number]

## 2010-09-03 NOTE — Letter (Signed)
July 14, 2007   Leslye Peer, MD  [address]  [place], [place] [address]   Re:  [name], DIBUONO             DOB:  1986-02-15   Dear [name]:   I saw the patient back today and removed her chest tube.  The sutures  and incisions are healing well.  She is doing well overall.  Her chest x-  ray still shows a lot of airspace disease, but this appears to be  improving.  Her blood pressure was 127/82, pulse 100, respirations 18,  sats were 98%.  Lungs were clear bilaterally.  I plan to see her back in  3 weeks with a chest x-ray.   Ines Bloomer, M.D.  Electronically Signed   DPB/MEDQ  D:  07/14/2007  T:  07/15/2007  Job:  [id number]

## 2010-09-03 NOTE — H&P (Signed)
NAMEARLOA, PRAK NO.:  [id number]   MEDICAL RECORD NO.:  [id number]          PATIENT TYPE:  EMS   LOCATION:  ED                           [facility]   PHYSICIAN:  Lucita Ferrara, MD         DATE OF BIRTH:  12-11-1985   DATE OF ADMISSION:  01/14/2008  DATE OF DISCHARGE:                              HISTORY & PHYSICAL   HISTORY OF PRESENT ILLNESS:  The patient is a 25 year old who presented  to with [facility] after a suicide attempt that occurred  earlier in the daytime.  Apparently, the patient has a known history of  recently diagnosed bipolar disorder and she sees a psychiatrist as an  outpatient.  Currently, she is seeing Dr. Dellia Cloud.  She states that  she has been started on antidepressant medication including Wellbutrin  and Depakote for an unknown period of time.  She tells me that they have  not really worked as far as her depression.  Her  positive indicators of  depression include:  Problems sleeping, anhedonia, not wanting to go on  anymore, disturbance of sleep, lack  of energy, and depression.  She  says that she does have high and lows.  She denies current manic  episodes, pressured speech or spending an excessive amount of  money.  In the interim, she also supposedly hit her head to the wall after she  intentionally took the medications.  She tells me that she has taken  likely half of the bottle of Depakote, half of the bottle of the  Wellbutrin and a handful of Benadryl, which is approximately one half of  the bottle.  In addition, she tells me after she hit her head, she  denies any focal neurological deficits, headache, neck pain or changes  in her cognition.   Otherwise, 12-point review of systems is negative.  She denies a history  of substance abuse, alcohol intoxication or alcohol use.  She denies  chest pain or shortness of breath.  She denies fevers or chills, neck  pain or signs and symptoms associated with  meningitis.   PAST MEDICAL HISTORY:  Significant for:  1. History of acute interstitial pneumonitis, status post admission      and discharge by Pulmonary Medicine on September 23, 2007.  2. Pneumothorax.  3. Anxiety and depression.  4. Status post video-assisted thorascopic surgery by Dr. Edwyna Shell in      June 24, 2007.  5. Status post lung biopsy.   REVIEW OF SYSTEMS:  As per HPI, otherwise negative.   SOCIAL HISTORY:  She denies drugs or alcohol.  She currently smokes one  half-pack per day.  Last menstrual period is December 24, 2007   ALLERGIES:  NO KNOWN DRUG ALLERGIES.   HOME MEDICATIONS:  1. Wellbutrin.  2. Depakote.   PHYSICAL EXAMINATION:  GENERAL:  The patient looks quite anxious,but she  is alert and oriented x3.  VITAL SIGNS:  Blood pressure 132/78, pulse 149, respirations 20,  temperature 97.9, pulse ox 99% on room air.  HEENT:  Normocephalic, atraumatic, sclerae is anicteric.  PERRLA.  Extraocular muscles intact.  NECK: Supple.  No JVD, no carotid bruits.  CARDIOVASCULAR:  S1 and S2 tachy.  No murmurs, rubs or clicks.  ABDOMEN: Soft, nontender, nondistended.  Positive bowel sounds.  NEURO:  The patient is alert and oriented x3.  Cranial nerves II-XII  grossly intact.  The patient is anxious.  On direct questioning,the  patient denies any active homicidal or suicidal ideations.   LABORATORY DATA:  Valproic acid level high at 191, normal range is 50-  100.  Urine drug screen is negative for amphetamines, barbiturates,  benzodiazepines, cocaine, opiates, and THC.  Aspirin level less than 4,  blood alcohol level less than 5.  Acetaminophen level 11.9. Basic  metabolic panel negative.  The patient had a CT scan of the head which  shows no acute intracranial abnormalities.  There is a moderately large  scalp hematoma above the nose in the midline.   EMERGENCY ROOM COURSE:  The patient was evaluated by the emergency room  doctor. The [facility] was called and,  per Control  recommendations, continuous monitoring on telemetry, EKG, IV fluid,  serial levels of Depakote every 6 hours for 3 times were recommended.   ASSESSMENT/PLAN:  The patient is a 25 year old with known bipolar  disorder with;  Suicide attempt with Depakote, Wellbutrin, Benadryl.  Obviously, the  tachycardia could be a result of anticholinergic side effects of  Benadryl, and Wellbutrin causing transient increase in serotonin or  epinephrine, which both can cause transient tachycardia.  EKG:  No  significant QT prolongation.   PLAN:  Per Poison Control recommendations, we will go ahead and admit  the patient to the Medical Telemetry Unit, continue monitoring, Depakote  level q.6 hours x 24 hours.  Watch renal and hepatic function.  We will  get EKG q.12 hours to watch for QT prolongation, one-on-one observation  and Psych consultation for swift transition to appropriate level and  area of care which would be psychiatric evaluation.  I will defer any  psychiatry for  changes in medication or p.r.n. medications upon  psychiatric discretion.      Lucita Ferrara, MD  Electronically Signed     RR/MEDQ  D:  01/14/2008  T:  01/14/2008  Job:  [id number]

## 2010-09-03 NOTE — Discharge Summary (Signed)
[name], [name]             ACCOUNT NO.:  [id number]   MEDICAL RECORD NO.:  [id number]          PATIENT TYPE:  INP   LOCATION:  3027                         FACILITY:  [facility]   PHYSICIAN:  Leslye Peer, MD    DATE OF BIRTH:  [birth year]   DATE OF ADMISSION:  06/16/2007  DATE OF DISCHARGE:  07/11/2007                               DISCHARGE SUMMARY   DISCHARGE DIAGNOSIS:  1. Acute interstitial Pneumonitis  (AIP).  2. Pneumothorax.  3. Anxiety/depression.   HISTORY OF PRESENT ILLNESS:  Ms. [name] is a 25 year old white female  with very little past medical history.  She developed a viral syndrome  approximately one month prior to admission with subsequent cough and  dyspnea.  She was treated with outpatient antibiotics without  improvement.  She also received steroids times one dose.  She presented  to the emergency department at [facility] for evaluation of  increasing dyspnea on exertion and worsening over the 3-4 days prior to  admission.  She was found to be hypoxemic.  Chest x-ray at that time  showed bilateral air space, predominantly basilar. CT on February 22, it  was negative for PE, demonstrated diffuse ground glass with minimal LAD.  She was started on broad-spectrum antibiotics and corticosteroids on  February 25.  All cultures and antibiotic courses completed.  She  underwent video-assisted thoracic surgery per Dr. Karle Plumber on  June 24, 2007.   MICRODATA:  BAL fungal smear was negative.  AFB was negative.  Sputum  with rare Gram positive cocci which was normal flora.  Legionella was  negative.  Left pleural fluid was negative.  Pleural fluid AFB is  pending.  Left pleural fungal negative.  Left lung tissue negative.  Left lung viral cultures negative.  TTE showed normal LV function.  Flu  A&B were negative.  Crypto antigen was negative.  CMV, PCR, IgG, IgM  were negative.  Histoplasmosis ELISA was negative.  Complement and cold  agglutens are  normal.  ANA was normal.  ANCA was negative.  AGM, AGBM,  AB negative, RF negative,  antihistone negative, pertussis negative.  A  VATS biopsy demonstrated diffuse alveolar damage.  No overt fibroblastic  foci, no other indicators of COP or BOOP.  Her antibiotics consisted of  Bactrim as an outpatient and Levofloxacin from February 23-24,  moxifloxacin February 25-25, Zosyn from February 25-March 4, vancomycin  from February 25-March 4, Zithromax from February 25-March 4.  Chest  tubes times two of the left all removed by March 20.  Chest tube on the  right from March 5-March 12.  Prophylaxis and best practice consisted of  Protonix and sequential hose.   LABORATORY DATA:  Chest x-ray shows persistent air space disease  process, persistent pneumomediastinum and left air seen in the neck.  Small apical pneumothorax on the left side.  Chest x-ray demonstrated  two left sided chest tubes without definite left sided  the  pneumothorax.  Single right sided chest tube with 10% apical  pneumothorax.  The right internal jugular catheter was in good position.  Arterial blood gases, pH 7.4,  PCO2 42, PO2 of 106.  WBCs 12.5,  hemoglobin 10.8, hematocrit 34.9, WBC 12.5.  Platelets 179.  ESR 74, INR  1.2, PTT 23.  Sodium 137, potassium 4.3, chloride 101, CO2 29, glucose  133, BUN 13, creatinine 0.39, total protein 5.6, albumin 2.7, AST 21,  ALT 16, ALP 47, total bilirubin 1.2, LDH is 490, phosphorous 4.0,  troponin I is 0.01, TSH 0.467. HIV was nonreactive.  C-reactive protein  was high at 12.5.  Pleural fluid demonstrated few WBCs, no organisms  seen.  Sputum showed normal flora.  Legionella was negative.  Tissue  culture, no organisms seen.  AFB smears, no AFB is seen.  No yeast or  fungal elements are seen in pleural fluid.  Viral culture, no virus.  Found RA factors less than 20.  Bordetella pertussis panel was negative.   HOSPITAL COURSE:  1. AIP.  She was admitted to [facility],  underwent video-      assisted thoracic surgery and had chest tubes times two on the left      and chest tubes times one on the right.  She required prolonged      chest tubes for persistent apical pneumothorax which resolved prior      to her discharge.  She will be followed up by Dr. Edwyna Shell on an      outpatient basis for continued evaluation from surgical      interventions.  2. Pneumothorax.  Stable with subsequent chest tube removal.  Again,      she will follow up with Dr. Edwyna Shell on an outpatient basis.  3. Anxiety/depression.  There were long discussions with the patient      about interventions.  Also, with her family.  There is no suicidal      ideation.  It was highly recommended she seek counseling as an      outpatient.  She was placed on Xanax and Lexapro for situational      depression.   DISCHARGE MEDICATIONS:  1. Prednisone 20 mg once daily.  2. Xanax 0.5 mg once daily.  3. Lexapro 10 mg once daily.  4. Tramadol 50 mg one every six hours as needed for mild to moderate      pain.  5. Vicodin 5 mg 1-2 every 6 hours as needed for severe pain.   DISCHARGE INSTRUCTIONS:  1. Diet:  As tolerated.  2. Dressing changed to her surgical sites daily.  3. She is to follow up with Dr. Delton Coombes on an outpatient basis to make      an appointment and Dr. Edwyna Shell within one week and call and make an      appointment.   DISPOSITION/CONDITION ON DISCHARGE:  Pneumothorax resolved.  AIP is  being treated with steroids.  She will be evaluated on an outpatient  basis by Dr. Edwyna Shell and Dr. Delton Coombes.  She is being discharged home in  improved condition.      Devra Dopp, MSN, ACNP      Leslye Peer, MD  Electronically Signed    SM/MEDQ  D:  09/23/2007  T:  09/23/2007  Job:  [id number]

## 2010-09-06 NOTE — Op Note (Signed)
[name], [name]             ACCOUNT NO.:  [id number]   MEDICAL RECORD NO.:  [id number]          PATIENT TYPE:  AMB   LOCATION:  DAY                           FACILITY:  [facility]   PHYSICIAN:  R. Roetta Sessions, M.D. DATE OF BIRTH:  [birth year]   DATE OF PROCEDURE:  09/03/2005  DATE OF DISCHARGE:                                 OPERATIVE REPORT   PROCEDURE:  Colonoscopy, diagnostic.   INDICATIONS FOR PROCEDURE:  The patient is a 25 year old Caucasian female  with intermittent left-sided abdominal pain and painful hematochezia.  Colonoscopy is now being done.  This procedure has been discussed with the  patient at length.  The potential risks, benefits, and alternatives have  been reviewed.  Questions answered.  She is agreeable.  Please see  documentation in the medical record.   PROCEDURE NOTE:  O2 saturation, blood pressure, pulse oximetry monitored  throughout the entire procedure.   CONSCIOUS SEDATION:  Versed 5 mg IV, Demerol 100 mg IV in divided doses.   INSTRUMENT:  Olympus video chip system pediatric colonoscope.   FINDINGS:  Digital exam revealed no abnormalities.  Endoscopic findings:  Prep was excellent.  Rectum:  Examination of the rectum mucosa including  retroflexed view of the anal verge revealed only minimal friable anal canal  hemorrhoids.  Rectal mucosa appeared entirely normal.   Colonic mucosa was surveyed from the rectosigmoid junction, through the  left, transverse, and right colon, to the appendiceal orifice, ileocecal  valve, and cecum.  These structures were well seen and photographed for the  record.  I attempted to intubate the terminal ileum and was unsuccessful.  From this level, the scope was slowly withdrawn.  All previously mentioned  mucosal surfaces were again seen.  The colonic mucosa appeared entirely  normal.  The patient tolerated the procedure well and was reactivated.   ENDOSCOPIC IMPRESSION:  Anal canal hemorrhoids.  Otherwise normal  rectum,  normal colon.  I suspect the patient has irritable bowel syndrome and bled  from hemorrhoids.   RECOMMENDATIONS:  1.  Benefiber 1 tbsp2 daily.  2.  Anusol-HC suppositories 1 per rectum at bedtime x10 days.  3.  NuLev 1 tablet on the tongue before meals as needed for abdominal pain.  4.  Will plan to see this nice lady back in three months to see how she is      doing.      Jonathon Bellows, M.D.  Electronically Signed     RMR/MEDQ  D:  09/03/2005  T:  09/03/2005  Job:  [id number]   cc:   Patrica Duel, M.D.  Fax: [phone]

## 2010-09-06 NOTE — Consult Note (Signed)
[name], [name]              ACCOUNT NO.:  [id number]   MEDICAL RECORD NO.:  [id number]           PATIENT TYPE:   LOCATION:                                 FACILITY:   PHYSICIAN:  R. Roetta Sessions, M.D. DATE OF BIRTH:  1985-09-23   DATE OF CONSULTATION:  08/25/2005  DATE OF DISCHARGE:                                   CONSULTATION   REASON FOR CONSULTATION:  Abdominal pain, hematochezia.   HISTORY OF PRESENT ILLNESS:  The patient is a 25 year old Caucasian female  sent over by courtesy of Dr. Nobie Putnam for further evaluation of a four to  five-month history of intermittent left-sided abdominal cramps and  occasional blood per rectum seen with wiping.  Symptoms started insidiously  the end of last year.  She generally has abdominal cramps after she eats,  but may occur at other times.  She has not had any associated nausea,  vomiting, odynophagia, dysphagia, early satiety, reflux symptoms.  She has  had __________  hematochezia.  She has one to two bowel movements daily.  Generally speaking she does not go two or three days without a bowel  movement nor does she ever have three or four bowel movements a day.  She  has not lose any weight.  The family history is positive for ulcerative  colitis in her 62 year old father.  There is no history of colorectal  neoplasia.  .  She tells me she had a flare of the above-mentioned symptoms  with antibiotics for urinary tract infection back in December of 2006.   PAST MEDICAL HISTORY:  Unremarkable for chronic illnesses.   PAST SURGICAL HISTORY:  None.   CURRENT MEDICATIONS:  Tricyclic birth control, vitamin C supplement daily.   ALLERGIES:  No known drug allergies.   FAMILY HISTORY:  As outlined above.  She is an only child.   SOCIAL HISTORY:  Patient is single.  She lives at home.  She works at the  [employer].  She rarely consumes alcoholic beverage.  No illicit drugs.  No tobacco.   REVIEW OF SYSTEMS:  No chest pain.  No  dyspnea on exertion.  No fever,  chills.  No change in weight.   PHYSICAL EXAMINATION:  GENERAL:  Pleasant 25 year old Caucasian female  resting comfortably.  VITAL SIGNS:  Weight 127.5, height 5 feet 5 inches, temperature 97.9, blood  pressure 102/64, pulse 72.  SKIN:  Warm and dry.  There is no jaundice.  HEENT:  No scleral icterus.  Conjunctivae are pink.  CHEST:  Lungs are clear to auscultation.  CARDIOVASCULAR:  Regular rate and rhythm without murmurs, rubs, or gallops.  ABDOMEN:  Nondistended.  Positive bowel sounds.  Soft, nontender without  appreciable mass or organomegaly.  RECTAL:  Deferred until time of colonoscopy.   IMPRESSION:  The patient is a pleasant 25 year old lady with intermittent  left-sided abdominal pain and __________  hematochezia.  She has a positive  family history of ulcerative colitis.   Symptoms of postprandial abdominal cramping sound reminiscent of irritable  bowel syndrome; however, abdominal pain may not at times be associated with  meals.  She has had low volume hematochezia as well.  These symptoms demand  further evaluation __________ an and colonoscopy.  Potential risks,  benefits, and alternatives have been reviewed.  Questions answered.  She is  agreeable.  Will plan to perform colonoscopy in the near future.  Further  recommendations to follow.   I would like to thank Dr. Patrica Duel for kind referral.   ADDENDUM:  Laboratories from May 29, 2005:  CBC, chem-20, and TSH all  entirely normal.      R. Roetta Sessions, M.D.  Electronically Signed     RMR/MEDQ  D:  08/25/2005  T:  08/25/2005  Job:  [id number]

## 2010-11-18 ENCOUNTER — Emergency Department (HOSPITAL_COMMUNITY): Payer: BC Managed Care – PPO

## 2010-11-18 ENCOUNTER — Inpatient Hospital Stay (HOSPITAL_COMMUNITY)
Admission: EM | Admit: 2010-11-18 | Discharge: 2010-11-21 | DRG: 582 | Disposition: A | Discharge status: Other Acute Inpatient Hospital | Payer: BC Managed Care – PPO | Attending: Internal Medicine | Admitting: Internal Medicine

## 2010-11-18 DIAGNOSIS — Y92009 Unspecified place in unspecified non-institutional (private) residence as the place of occurrence of the external cause: Secondary | ICD-10-CM

## 2010-11-18 DIAGNOSIS — T50992A Poisoning by other drugs, medicaments and biological substances, intentional self-harm, initial encounter: Secondary | ICD-10-CM | POA: Diagnosis present

## 2010-11-18 DIAGNOSIS — T424X4A Poisoning by benzodiazepines, undetermined, initial encounter: Secondary | ICD-10-CM | POA: Diagnosis present

## 2010-11-18 DIAGNOSIS — Z79899 Other long term (current) drug therapy: Secondary | ICD-10-CM

## 2010-11-18 DIAGNOSIS — T50901A Poisoning by unspecified drugs, medicaments and biological substances, accidental (unintentional), initial encounter: Secondary | ICD-10-CM

## 2010-11-18 DIAGNOSIS — F411 Generalized anxiety disorder: Secondary | ICD-10-CM | POA: Diagnosis present

## 2010-11-18 DIAGNOSIS — J8410 Pulmonary fibrosis, unspecified: Secondary | ICD-10-CM | POA: Diagnosis present

## 2010-11-18 DIAGNOSIS — J9600 Acute respiratory failure, unspecified whether with hypoxia or hypercapnia: Secondary | ICD-10-CM

## 2010-11-18 DIAGNOSIS — F319 Bipolar disorder, unspecified: Secondary | ICD-10-CM | POA: Diagnosis present

## 2010-11-18 DIAGNOSIS — F17200 Nicotine dependence, unspecified, uncomplicated: Secondary | ICD-10-CM | POA: Diagnosis present

## 2010-11-18 DIAGNOSIS — G929 Unspecified toxic encephalopathy: Secondary | ICD-10-CM | POA: Diagnosis present

## 2010-11-18 DIAGNOSIS — T426X1A Poisoning by other antiepileptic and sedative-hypnotic drugs, accidental (unintentional), initial encounter: Principal | ICD-10-CM | POA: Diagnosis present

## 2010-11-18 DIAGNOSIS — T438X2A Poisoning by other psychotropic drugs, intentional self-harm, initial encounter: Secondary | ICD-10-CM | POA: Diagnosis present

## 2010-11-18 DIAGNOSIS — G92 Toxic encephalopathy: Secondary | ICD-10-CM | POA: Diagnosis present

## 2010-11-18 DIAGNOSIS — T43502A Poisoning by unspecified antipsychotics and neuroleptics, intentional self-harm, initial encounter: Secondary | ICD-10-CM | POA: Diagnosis present

## 2010-11-18 DIAGNOSIS — E876 Hypokalemia: Secondary | ICD-10-CM | POA: Diagnosis present

## 2010-11-18 LAB — CBC
HCT: 38.6 % (ref 36.0–46.0)
MCHC: 33.9 g/dL (ref 30.0–36.0)
MCV: 93.5 fL (ref 78.0–100.0)
RDW: 12.6 % (ref 11.5–15.5)

## 2010-11-18 LAB — VALPROIC ACID LEVEL: Valproic Acid Lvl: 148.7 ug/mL — ABNORMAL HIGH (ref 50.0–100.0)

## 2010-11-18 LAB — BLOOD GAS, ARTERIAL
Acid-base deficit: 0.6 mmol/L (ref 0.0–2.0)
Bicarbonate: 24.9 mEq/L — ABNORMAL HIGH (ref 20.0–24.0)
FIO2: 0.5 %
MECHVT: 500 mL
TCO2: 22.6 mmol/L (ref 0–100)
pCO2 arterial: 46.7 mmHg — ABNORMAL HIGH (ref 35.0–45.0)
pO2, Arterial: 230 mmHg — ABNORMAL HIGH (ref 80.0–100.0)

## 2010-11-18 LAB — URINALYSIS, ROUTINE W REFLEX MICROSCOPIC
Bilirubin Urine: NEGATIVE
Nitrite: NEGATIVE
Specific Gravity, Urine: 1.008 (ref 1.005–1.030)
Urobilinogen, UA: 0.2 mg/dL (ref 0.0–1.0)

## 2010-11-18 LAB — RAPID URINE DRUG SCREEN, HOSP PERFORMED
Cocaine: NOT DETECTED
Opiates: NOT DETECTED
Tetrahydrocannabinol: NOT DETECTED

## 2010-11-18 LAB — DIFFERENTIAL
Eosinophils Relative: 1 % (ref 0–5)
Lymphocytes Relative: 26 % (ref 12–46)
Lymphs Abs: 2.2 10*3/uL (ref 0.7–4.0)
Monocytes Absolute: 0.6 10*3/uL (ref 0.1–1.0)

## 2010-11-18 LAB — CARDIAC PANEL(CRET KIN+CKTOT+MB+TROPI)
CK, MB: 1.7 ng/mL (ref 0.3–4.0)
Relative Index: INVALID (ref 0.0–2.5)
Troponin I: <0.3 ng/mL (ref <0.30)

## 2010-11-18 LAB — COMPREHENSIVE METABOLIC PANEL
Albumin: 3.9 g/dL (ref 3.5–5.2)
BUN: 9 mg/dL (ref 6–23)
Creatinine, Ser: 0.49 mg/dL — ABNORMAL LOW (ref 0.50–1.10)
Total Protein: 7.1 g/dL (ref 6.0–8.3)

## 2010-11-18 LAB — SALICYLATE LEVEL: Salicylate Lvl: <2 mg/dL — ABNORMAL LOW (ref 2.8–20.0)

## 2010-11-18 LAB — URINE MICROSCOPIC-ADD ON

## 2010-11-18 LAB — ETHANOL: Alcohol, Ethyl (B): 62 mg/dL — ABNORMAL HIGH (ref 0–11)

## 2010-11-18 LAB — MRSA PCR SCREENING: MRSA by PCR: INVALID — AB

## 2010-11-18 LAB — MAGNESIUM: Magnesium: 2 mg/dL (ref 1.5–2.5)

## 2010-11-19 ENCOUNTER — Inpatient Hospital Stay (HOSPITAL_COMMUNITY): Payer: BC Managed Care – PPO

## 2010-11-19 DIAGNOSIS — T50992A Poisoning by other drugs, medicaments and biological substances, intentional self-harm, initial encounter: Secondary | ICD-10-CM

## 2010-11-19 DIAGNOSIS — J9600 Acute respiratory failure, unspecified whether with hypoxia or hypercapnia: Secondary | ICD-10-CM

## 2010-11-19 DIAGNOSIS — T50901A Poisoning by unspecified drugs, medicaments and biological substances, accidental (unintentional), initial encounter: Secondary | ICD-10-CM

## 2010-11-19 LAB — AMMONIA
Ammonia: 39 umol/L (ref 11–60)
Ammonia: 136 umol/L — ABNORMAL HIGH (ref 11–60)

## 2010-11-19 LAB — CBC
HCT: 34.4 % — ABNORMAL LOW (ref 36.0–46.0)
HCT: 33.2 % — ABNORMAL LOW (ref 36.0–46.0)
Hemoglobin: 11.9 g/dL — ABNORMAL LOW (ref 12.0–15.0)
MCH: 33.1 pg (ref 26.0–34.0)
MCHC: 34.6 g/dL (ref 30.0–36.0)
MCHC: 33.1 g/dL (ref 30.0–36.0)
MCV: 96.8 fL (ref 78.0–100.0)
Platelets: 171 10*3/uL (ref 150–400)
RBC: 3.6 MIL/uL — ABNORMAL LOW (ref 3.87–5.11)
RDW: 13.1 % (ref 11.5–15.5)
WBC: 10.7 10*3/uL — ABNORMAL HIGH (ref 4.0–10.5)

## 2010-11-19 LAB — BASIC METABOLIC PANEL
BUN: 7 mg/dL (ref 6–23)
CO2: 24 mEq/L (ref 19–32)
Calcium: 7.8 mg/dL — ABNORMAL LOW (ref 8.4–10.5)
GFR calc non Af Amer: >60 mL/min (ref >60)
Glucose, Bld: 104 mg/dL — ABNORMAL HIGH (ref 70–99)
Potassium: 3.3 mEq/L — ABNORMAL LOW (ref 3.5–5.1)
Sodium: 142 mEq/L (ref 135–145)

## 2010-11-19 LAB — CARDIAC PANEL(CRET KIN+CKTOT+MB+TROPI)
CK, MB: 1.6 ng/mL (ref 0.3–4.0)
CK, MB: 3.2 ng/mL (ref 0.3–4.0)
Total CK: 67 U/L (ref 7–177)
Total CK: 275 U/L — ABNORMAL HIGH (ref 7–177)
Troponin I: <0.3 ng/mL (ref <0.30)

## 2010-11-19 LAB — BLOOD GAS, ARTERIAL
Acid-Base Excess: 0.6 mmol/L (ref 0.0–2.0)
Bicarbonate: 26.4 mEq/L — ABNORMAL HIGH (ref 20.0–24.0)
Bicarbonate: 24.4 mEq/L — ABNORMAL HIGH (ref 20.0–24.0)
Drawn by: 307971
FIO2: 1 %
FIO2: 0.3 %
MECHVT: 500 mL
Patient temperature: 98.6
Patient temperature: 102.4
TCO2: 21.9 mmol/L (ref 0–100)
pH, Arterial: 7.264 — ABNORMAL LOW (ref 7.350–7.400)

## 2010-11-19 LAB — COMPREHENSIVE METABOLIC PANEL
AST: 13 U/L (ref 0–37)
Albumin: 2.9 g/dL — ABNORMAL LOW (ref 3.5–5.2)
BUN: 7 mg/dL (ref 6–23)
Calcium: 7.8 mg/dL — ABNORMAL LOW (ref 8.4–10.5)
Creatinine, Ser: <0.47 mg/dL — ABNORMAL LOW (ref 0.50–1.10)
Total Bilirubin: 0.4 mg/dL (ref 0.3–1.2)
Total Protein: 5.7 g/dL — ABNORMAL LOW (ref 6.0–8.3)

## 2010-11-19 LAB — VALPROIC ACID LEVEL
Valproic Acid Lvl: 159.6 ug/mL — ABNORMAL HIGH (ref 50.0–100.0)
Valproic Acid Lvl: 123.3 ug/mL — ABNORMAL HIGH (ref 50.0–100.0)
Valproic Acid Lvl: 100.2 ug/mL — ABNORMAL HIGH (ref 50.0–100.0)

## 2010-11-20 ENCOUNTER — Inpatient Hospital Stay (HOSPITAL_COMMUNITY): Payer: BC Managed Care – PPO

## 2010-11-20 LAB — COMPREHENSIVE METABOLIC PANEL
ALT: 8 U/L (ref 0–35)
Albumin: 2.6 g/dL — ABNORMAL LOW (ref 3.5–5.2)
Alkaline Phosphatase: 52 U/L (ref 39–117)
Calcium: 8.4 mg/dL (ref 8.4–10.5)
Potassium: 3 mEq/L — ABNORMAL LOW (ref 3.5–5.1)
Sodium: 142 mEq/L (ref 135–145)
Total Protein: 5.3 g/dL — ABNORMAL LOW (ref 6.0–8.3)

## 2010-11-20 LAB — VALPROIC ACID LEVEL
Valproic Acid Lvl: 94.7 ug/mL (ref 50.0–100.0)
Valproic Acid Lvl: 124 ug/mL — ABNORMAL HIGH (ref 50.0–100.0)

## 2010-11-20 LAB — CBC
MCHC: 35 g/dL (ref 30.0–36.0)
Platelets: 138 10*3/uL — ABNORMAL LOW (ref 150–400)
RDW: 12.7 % (ref 11.5–15.5)

## 2010-11-20 LAB — AMMONIA: Ammonia: 97 umol/L — ABNORMAL HIGH (ref 11–60)

## 2010-11-21 ENCOUNTER — Inpatient Hospital Stay (HOSPITAL_COMMUNITY): Payer: BC Managed Care – PPO

## 2010-11-21 ENCOUNTER — Inpatient Hospital Stay (HOSPITAL_COMMUNITY)
Admission: AD | Admit: 2010-11-21 | Discharge: 2010-11-25 | DRG: 430 | Disposition: A | Discharge status: Home / Self Care | Payer: BC Managed Care – PPO | Source: Ambulatory Visit | Attending: Psychiatry | Admitting: Psychiatry

## 2010-11-21 DIAGNOSIS — F3112 Bipolar disorder, current episode manic without psychotic features, moderate: Secondary | ICD-10-CM

## 2010-11-21 DIAGNOSIS — F1010 Alcohol abuse, uncomplicated: Secondary | ICD-10-CM

## 2010-11-21 DIAGNOSIS — T43502A Poisoning by unspecified antipsychotics and neuroleptics, intentional self-harm, initial encounter: Secondary | ICD-10-CM

## 2010-11-21 DIAGNOSIS — T426X1A Poisoning by other antiepileptic and sedative-hypnotic drugs, accidental (unintentional), initial encounter: Secondary | ICD-10-CM

## 2010-11-21 DIAGNOSIS — T50992A Poisoning by other drugs, medicaments and biological substances, intentional self-harm, initial encounter: Secondary | ICD-10-CM

## 2010-11-21 DIAGNOSIS — F411 Generalized anxiety disorder: Secondary | ICD-10-CM

## 2010-11-21 DIAGNOSIS — Z79899 Other long term (current) drug therapy: Secondary | ICD-10-CM

## 2010-11-21 DIAGNOSIS — T424X4A Poisoning by benzodiazepines, undetermined, initial encounter: Secondary | ICD-10-CM

## 2010-11-21 DIAGNOSIS — F3160 Bipolar disorder, current episode mixed, unspecified: Principal | ICD-10-CM

## 2010-11-21 LAB — COMPREHENSIVE METABOLIC PANEL
ALT: 9 U/L (ref 0–35)
Albumin: 2.8 g/dL — ABNORMAL LOW (ref 3.5–5.2)
Alkaline Phosphatase: 54 U/L (ref 39–117)
BUN: 12 mg/dL (ref 6–23)
Chloride: 106 mEq/L (ref 96–112)
Potassium: 3.1 mEq/L — ABNORMAL LOW (ref 3.5–5.1)
Sodium: 139 mEq/L (ref 135–145)
Total Bilirubin: 0.3 mg/dL (ref 0.3–1.2)
Total Protein: 5.5 g/dL — ABNORMAL LOW (ref 6.0–8.3)

## 2010-11-21 LAB — CBC
HCT: 33.1 % — ABNORMAL LOW (ref 36.0–46.0)
Hemoglobin: 11.3 g/dL — ABNORMAL LOW (ref 12.0–15.0)
MCHC: 34.1 g/dL (ref 30.0–36.0)
RDW: 12.2 % (ref 11.5–15.5)
WBC: 6 10*3/uL (ref 4.0–10.5)

## 2010-11-21 LAB — MRSA CULTURE

## 2010-11-21 LAB — VALPROIC ACID LEVEL: Valproic Acid Lvl: 97.3 ug/mL (ref 50.0–100.0)

## 2010-11-21 LAB — AMMONIA: Ammonia: 41 umol/L (ref 11–60)

## 2010-11-22 DIAGNOSIS — F1010 Alcohol abuse, uncomplicated: Secondary | ICD-10-CM

## 2010-11-22 DIAGNOSIS — F3160 Bipolar disorder, current episode mixed, unspecified: Secondary | ICD-10-CM

## 2010-11-22 DIAGNOSIS — F411 Generalized anxiety disorder: Secondary | ICD-10-CM

## 2010-11-25 LAB — CULTURE, BLOOD (ROUTINE X 2): Culture  Setup Time: 201207310906

## 2010-11-25 NOTE — Assessment & Plan Note (Signed)
NAMEKATALENA, [name] NO.:  [id number]  MEDICAL RECORD NO.:  [id number]  LOCATION:  0503                          FACILITY:  [facility]  PHYSICIAN:  Franchot Gallo, MD     DATE OF BIRTH:  06-06-1985  DATE OF ADMISSION:  11/21/2010 DATE OF DISCHARGE:                      PSYCHIATRIC ADMISSION ASSESSMENT   This is a 25 year old female who was voluntarily admitted on November 21, 2010.  HISTORY OF PRESENT ILLNESS:  The patient is here as a transfer from the medical floor after the patient was stabilized after an overdose of Xanax and Depakote and drinking alcohol.  The patient was admitted with respiratory failure and was on a ventilator.  She currently was medically cleared and is here for further observation.  The patient reports that she has been drinking, binge drinking, up to one to three times a week.  She states that she was drinking at the time of the overdose.  She is feeling very sad.  She also had a self-inflicted injury cutting her hand at the time which she does not remember.  She reports having symptoms of anxiety mainly at work and when she drives and when she does have symptoms of anxiety, she rates it a 10.  She sleeps well with her medications.  Currently her appetite is decreased but feels it is due to the charcoal.  She denies any psychotic symptoms. She denies any suicidal thoughts.  She is motivated to get help with her use of alcohol.  PAST PSYCHIATRIC HISTORY:  The patient was here approximately three years ago after a history of an overdose.  She currently sees Dr. Evelene Croon  being treated for bipolar disorder.  SOCIAL HISTORY:  She is married and recently got married July 14.  She has no children.  She works at [employer] and she resides in [place].  FAMILY HISTORY:  None.  ALCOHOL/DRUG HISTORY:  As above.  The patient reports alcohol has been ongoing issue for her with binge drinking up to one to three times a week.  Denies any legal issues  regarding alcohol, experiencing blackouts.  Denies any seizure activity.  PRIMARY CARE PROVIDER:  None.  MEDICAL PROBLEMS:  Status post overdose.  History of respiratory failure.  MEDICATIONS: 1. Depakote 500 mg at bedtime. 2. Wellbutrin 150 mg at bedtime. 3. Vistaril 25 mg at bedtime as needed. 4. Xanax 1 mg p.r.n. on a t.i.d. basis.  DRUG ALLERGIES:  Lamictal in that the patient has a history of a rash.  PHYSICAL EXAMINATION:  This is a normally-developed female.  She appears in no distress.  She offers no complaints.  LABORATORY DATA:  Laboratory data shows a pregnancy test that is negative.  Urine drug screen shows positive for benzodiazepines.  CBC that was within normal limits.  CMP that was within normal limits. Initial alcohol level was 62, magnesium of 2.  Urinalysis was within normal limits.  Acetaminophen level less than 15.  Valproic acid level was elevated at 148 and got as high as 210.2.  Valproic acid level at the time of transfer was 90.7.  MENTAL STATUS EXAM:  She is fully alert and cooperative.  She was seen with the interdisciplinary team very cooperative, casually dressed, good  eye contact.  She was feeling better.  She was again bright and agreeable to recommendations and suggestions already having a plan about possibly going to a rehab facility. She endorsed good support from her family and husband.  She was reporting feeling more hopeful and that she is able to choose the information, comparing it to her last hospitalization, feeling that she is more open and more mature.  ASSESSMENT:   AXIS I:  Bipolar one disorder mixed, alcohol abuse, generalized anxiety disorder. AXIS II:  Deferred. AXIS III:  Status post overdose on Depakote and Xanax. AXIS IV:  Possible problems with her primary support and other psychosocial problems with chronic mental illness. AXIS V:  Current is 35.  PLAN:  Our plan is to check her Depakote level; check her pulse ox  for any respiratory distress.  Will have Librium available 24 hours for any potential withdrawal symptoms with her benzodiazepine use and alcohol. Will also have Neurontin available on a t.i.d. basis for anxiety.  Will also contact her family for support and concerns and the patient is interested at this time is possibly going to [place] for her substance use.  Her tentative length of stay is four to five days.     Landry Corporal, N.P.   ______________________________ Franchot Gallo, MD    JO/MEDQ  D:  11/22/2010  T:  11/22/2010  Job:  [id number]  Electronically Signed by Limmie PatriciaP. on 11/25/2010 09:48:45 AM Electronically Signed by Franchot Gallo MD on 11/25/2010 02:53:45 PM

## 2010-11-28 NOTE — Consult Note (Signed)
  [name], [name] NO.:  [id number]  MEDICAL RECORD NO.:  [id number]  LOCATION:  1332                         FACILITY:  [facility]  PHYSICIAN:  Eulogio Ditch, MD DATE OF BIRTH:  [birth year]  DATE OF CONSULTATION:  11/21/2010 DATE OF DISCHARGE:                                CONSULTATION   REASON FOR CONSULTATION:  Overdose.  HISTORY OF PRESENT ILLNESS:  [age]-year-old Caucasian female, who has a long history of bipolar disorder, is followed by Dr. Huel Cote in the outpatient setting.  Patient reported that she was going through depressive phase of her bipolar disorder and her stress level at work. She works at [employer] for the last 5 years.  She was drinking before overdose and she told me that she did not made a rational decision. Patient has also overdosed in the past in 2009 and was admitted at behavioral health.  At that time, she overdosed on Depakote, Benadryl and Wellbutrin.  Patient is very calm, cooperative during the interview. Denies current suicidal ideation, but reported depressed mood or denied hearing any voices, is not internally preoccupied, alert, awake, oriented x3.  Patient denied any illegal drug abuse.  Patient reported that she goes through the mood swings a lot.  She was married recently 2 weeks ago.  I discussed different treatment options with the patient.  Patient at this time is agreeable for admission to behavioral health for further stabilization for medication wise and counseling.  DIAGNOSES:  AXIS I:  Bipolar disorder, depressed type. AXIS II:  Deferred. AXIS III:  See medical notes. AXIS IV:  Increased stress level at job. AXIS V:  40.  RECOMMENDATIONS: 1. Patient will be admitted to behavioral health for further     stabilization because of number of risk factors.  Patient is     agreeable for that. 2. Sitter can be continued at this time. 3. I did not start the patient on any medication as the admitting  doctor at behavioral health can discuss with     patient and start that treatment.  Also, the patient overdosed on     Depakote and was intubated and her ammonia level was high, so I     will not start any medications at this time.  Currently, her     Depakote level is 90.7.  On November 19, 2010, her Depakote level was     123.     Eulogio Ditch, MD     SA/MEDQ  D:  11/21/2010  T:  11/21/2010  Job:  [id number]  Electronically Signed by Eulogio Ditch  on 11/28/2010 08:53:14 AM

## 2010-12-01 NOTE — Discharge Summary (Signed)
  NAMEBREELLA, VANOSTRAND NO.:  [id number]  MEDICAL RECORD NO.:  [id number]  LOCATION:                                 FACILITY:  PHYSICIAN:  Franchot Gallo, MD     DATE OF BIRTH:  April 03, 1986  DATE OF ADMISSION:  11/21/2010 DATE OF DISCHARGE:  11/25/2010                              DISCHARGE SUMMARY   REASON FOR ADMISSION:  The patient was a transfer from the medical floor after the patient was stabilized after an overdose of Xanax and Depakote and drinking alcohol.  She was in respiratory failure and was placed on a ventilator.  She also had a self-inflicted injury cutting her hand at the time of overdose and alcohol use which she did not remember.  FINAL IMPRESSION:  AXIS I: Bipolar one disorder, mixed under good control, generalized anxiety disorder.  Alcohol abuse. AXIS II: Deferred. AXIS III: No acute illnesses. AXIS IV: Is limited primary support system, chronic mental illness, work stress. AXIS V: GAF at discharge 65.  PERTINENT LABORATORY DATA:  On admission to the medical unit, the patient was positive for benzodiazepines.  Her alcohol level was 62. Valproic acid level was at one time elevated up to 210, and at time of transfer was 90.7.  SIGNIFICANT FINDINGS:  The patient was admitted to the adult milieu for safety and stabilization.  We continued to monitor her Depakote level, check her pulse ox for any respiratory stress.  We had Librium available for any possible withdrawal symptoms.  We had contact with the patient's husband to assess any safety concerns and for [place] to provide information.  He reported that he felt the patient was doing better and had no concerns that she was a danger to herself or anyone else.  She was participating in groups.  She was doing well, not having any withdrawal symptoms or cravings, although, having problems with significant anxiety rating it a 9/10.  On day of discharge, the patient's sleep was good.  Her  appetite was good.  Her depression had resolved rating it a 1 on a scale of 1-10.  She adamantly denied any suicidal or homicidal thoughts or auditory visual hallucinations, having mild anxiety symptoms, rating it a 2-3, having no medication side effects.  DISCHARGE MEDICATIONS: 1. Gabapentin 300 mg one t.i.d. 2. Symbyax 6/25 one q.h.s. 3. Depakote ER 500 mg 1 capsule q.h.s.  FOLLOWUP:  Her follow-up appointment was with Caralyn Guile at [phone] and Dr. Lafayette Dragon on Thursday, August 9 at 3:00 p.m.     Landry Corporal, N.P.   ______________________________ Franchot Gallo, MD    JO/MEDQ  D:  11/27/2010  T:  11/27/2010  Job:  [id number]  Electronically Signed by Limmie PatriciaP. on 11/29/2010 03:00:43 PM Electronically Signed by Franchot Gallo MD on 12/01/2010 09:50:39 PM

## 2010-12-02 ENCOUNTER — Ambulatory Visit (INDEPENDENT_AMBULATORY_CARE_PROVIDER_SITE_OTHER): Payer: BC Managed Care – PPO | Admitting: Psychology

## 2010-12-02 DIAGNOSIS — F1010 Alcohol abuse, uncomplicated: Secondary | ICD-10-CM

## 2010-12-02 DIAGNOSIS — F319 Bipolar disorder, unspecified: Secondary | ICD-10-CM

## 2010-12-03 NOTE — Discharge Summary (Signed)
NAMECARAN, STORCK NO.:  [id number]  MEDICAL RECORD NO.:  [id number]  LOCATION:  1332                         FACILITY:  [facility]  PHYSICIAN:  Erick Blinks, MD     DATE OF BIRTH:  1985-08-03  DATE OF ADMISSION:  11/18/2010 DATE OF DISCHARGE:                        DISCHARGE SUMMARY - REFERRING   DISCHARGE DIAGNOSES: 1. Intentional overdose with Depakote. 2. Bipolar disorder. 3. Suicide attempt. 4. Toxic metabolic encephalopathy requiring intubation and a     mechanical ventilation for a airway protection. 5. Hepatic encephalopathy secondary to #1, improved. 6. Hypokalemia. 7. Anxiety. 8. History of tobacco use. 9. Bipolar disorder.  DISCHARGE MEDICATIONS: 1. Nicotine 21 mg/24-hour patch transdermal daily. 2. Hydroxyzine 25 mg p.o. t.i.d.  MEDICATIONS STOPPED IN THE HOSPITAL:  Depakote.  ADMISSION HISTORY:  This is a 25 year old female with history of bipolar disorder who was taking Depakote as a mood stabilizer.  The patient reports having mood lability and ended up taking a large amount of Depakote.  She had also taken some Xanax as well.  She was found by her husband and was brought to the emergency room where she was noted to be encephalopathic and difficult to arouse.  The patient did require mechanical ventilation on admission for airway protection and was admitted to the critical care service on admission.  HOSPITAL COURSE: 1. Depakote overdose.  Poison control was following the patient while     here in the hospital.  She received activated charcoal.  She was     also followed up with serial labs.  It was noted that her Depakote     level on admission was 148, this peaked at 210.  The patient also     had an elevation in her ammonia level, which was 122 on admission     and peaked at 188.  The patient was placed on a lactulose therapy     as well as levocarnitine, with these measures her ammonia trended     down her.  Depakote level is  down to 90, which is in normal     range.  The patient was successfully extubated.  The remainder of     her chemistries are reasonably in normal limits.  The patient has     done well since then.  She was transferred to regular bed and     currently does not have any complaints.  Her IV fluids have been     discontinued.  She is getting some potassium supplementation as she     is having some loose stools from her lactulose.  The lactulose was     discontinued yesterday.  The patient was seen by Dr. Rogers Blocker from     the psychiatry service and felt that inpatient psychiatric     stabilization would be necessary.  Therefore, she will be     transferred to [facility] for stabilization.  Medically, she     is improved and stable.  We have not restarted her Depakote as well     as any of her other outpatient medications including Xanax.  We     will defer this to the psychiatry service.  The patient will be  transferred to [facility] once the bed is available.  There     was a question of possible aspiration pneumonia initially on     admission and the patient was started empirically on Unasyn that     was subsequently ruled out and the patient has not been on     antibiotics since yesterday.  She remains afebrile and clinically     stable.  DISCHARGE INSTRUCTIONS:  The patient should continue on a regular diet, conduct her activity as tolerated.  Follow up with primary care physician when she is discharged from [facility] as well as her primary psychiatrist.  CONSULTATION:  Psychiatry, Dr. Rogers Blocker.  1. Chest x-ray on admission shows poor inspiration with no gross     change in mild chronic interstitial lung disease. 2. Chest x-ray from July 31, shows no change from yesterday. 3. Chest x-ray from August 1 shows mild bibasilar atelectasis. 4. Chest x-ray from August 2 shows pulmonary vascular congestion, has     progressed suggesting fluid overload.  Time  spent on this discharge is 45 minutes     Erick Blinks, MD     JM/MEDQ  D:  11/21/2010  T:  11/21/2010  Job:  [id number]  Electronically Signed by Durward Mallard MEMON  on 12/03/2010 01:58:04 PM

## 2010-12-24 ENCOUNTER — Ambulatory Visit (INDEPENDENT_AMBULATORY_CARE_PROVIDER_SITE_OTHER): Payer: BC Managed Care – PPO | Admitting: Psychology

## 2010-12-24 DIAGNOSIS — F319 Bipolar disorder, unspecified: Secondary | ICD-10-CM

## 2010-12-24 DIAGNOSIS — F1010 Alcohol abuse, uncomplicated: Secondary | ICD-10-CM

## 2011-01-10 LAB — BLOOD GAS, ARTERIAL
Acid-base deficit: 0.8
Bicarbonate: 22.9
O2 Saturation: 94.6
TCO2: 23.9
pCO2 arterial: 34.3 — ABNORMAL LOW
pO2, Arterial: 76 — ABNORMAL LOW

## 2011-01-10 LAB — CBC
HCT: 29.1 — ABNORMAL LOW
HCT: 30.4 — ABNORMAL LOW
HCT: 33.5 — ABNORMAL LOW
Hemoglobin: 10.1 — ABNORMAL LOW
Hemoglobin: 10.5 — ABNORMAL LOW
Hemoglobin: 11.4 — ABNORMAL LOW
Hemoglobin: 11.9 — ABNORMAL LOW
Hemoglobin: 9.8 — ABNORMAL LOW
MCHC: 34.2
MCHC: 34.6
MCHC: 34.7
MCHC: 35.4
MCV: 96.9
MCV: 97.5
MCV: 97.6
MCV: 98.4
Platelets: 289
Platelets: 316
Platelets: 342
RBC: 2.9 — ABNORMAL LOW
RBC: 2.98 — ABNORMAL LOW
RBC: 3.46 — ABNORMAL LOW
RDW: 12.7
RDW: 12.8
RDW: 13.2
RDW: 13.3
WBC: 12 — ABNORMAL HIGH
WBC: 13.7 — ABNORMAL HIGH
WBC: 17.3 — ABNORMAL HIGH

## 2011-01-10 LAB — I-STAT 8, (EC8 V) (CONVERTED LAB)
Acid-base deficit: 3 — ABNORMAL HIGH
BUN: 11
Chloride: 100
Potassium: 3.3 — ABNORMAL LOW
pCO2, Ven: 40.9 — ABNORMAL LOW
pH, Ven: 7.353 — ABNORMAL HIGH

## 2011-01-10 LAB — BASIC METABOLIC PANEL
BUN: 10
BUN: 6
BUN: 8
CO2: 23
CO2: 30
Calcium: 7.9 — ABNORMAL LOW
Calcium: 8.4
Calcium: 8.4
Chloride: 106
Chloride: 99
Creatinine, Ser: 0.4
Creatinine, Ser: 0.5
GFR calc Af Amer: >60
GFR calc Af Amer: >60
GFR calc non Af Amer: >60
GFR calc non Af Amer: >60
GFR calc non Af Amer: >60
Glucose, Bld: 121 — ABNORMAL HIGH
Glucose, Bld: 140 — ABNORMAL HIGH
Glucose, Bld: 143 — ABNORMAL HIGH
Potassium: 3.8
Potassium: 4.1
Sodium: 133 — ABNORMAL LOW
Sodium: 134 — ABNORMAL LOW
Sodium: 137

## 2011-01-10 LAB — DIFFERENTIAL
Basophils Absolute: 0
Basophils Absolute: 0.1
Basophils Absolute: 0.2 — ABNORMAL HIGH
Basophils Relative: 0
Basophils Relative: 1
Basophils Relative: 1
Basophils Relative: 1
Eosinophils Absolute: 0
Eosinophils Absolute: 0
Eosinophils Absolute: 0.4
Eosinophils Relative: 0
Eosinophils Relative: 3
Lymphocytes Relative: 6 — ABNORMAL LOW
Lymphocytes Relative: 8 — ABNORMAL LOW
Lymphs Abs: 0.8
Lymphs Abs: 1.1
Monocytes Absolute: 0.5
Monocytes Absolute: 0.6
Monocytes Absolute: 0.9
Monocytes Absolute: 1
Monocytes Relative: 4
Monocytes Relative: 5
Monocytes Relative: 6
Neutro Abs: 10.7 — ABNORMAL HIGH
Neutro Abs: 11.3 — ABNORMAL HIGH
Neutro Abs: 11.5 — ABNORMAL HIGH
Neutro Abs: 15 — ABNORMAL HIGH
Neutrophils Relative %: 83 — ABNORMAL HIGH
Neutrophils Relative %: 87 — ABNORMAL HIGH
Neutrophils Relative %: 89 — ABNORMAL HIGH

## 2011-01-10 LAB — EXPECTORATED SPUTUM ASSESSMENT W GRAM STAIN, RFLX TO RESP C

## 2011-01-10 LAB — FUNGUS CULTURE W SMEAR: Fungal Smear: NONE SEEN

## 2011-01-10 LAB — CRYPTOCOCCAL ANTIGEN: Crypto Ag: NEGATIVE

## 2011-01-10 LAB — POCT I-STAT 3, ART BLOOD GAS (G3+)
Acid-Base Excess: 7 — ABNORMAL HIGH
Bicarbonate: 30.7 — ABNORMAL HIGH
O2 Saturation: 94
O2 Saturation: 98
Operator id: 244901
Operator id: 290241
Patient temperature: 97.8
TCO2: 32
pCO2 arterial: 33.9 — ABNORMAL LOW
pCO2 arterial: 40
pH, Arterial: 7.426 — ABNORMAL HIGH
pH, Arterial: 7.492 — ABNORMAL HIGH
pO2, Arterial: 102 — ABNORMAL HIGH
pO2, Arterial: 63 — ABNORMAL LOW

## 2011-01-10 LAB — URINE MICROSCOPIC-ADD ON

## 2011-01-10 LAB — HEPATIC FUNCTION PANEL
Bilirubin, Direct: 0.2
Indirect Bilirubin: 1 — ABNORMAL HIGH
Total Bilirubin: 1.2

## 2011-01-10 LAB — INFLUENZA A+B VIRUS AG-DIRECT(RAPID)
Inflenza A Ag: NEGATIVE
Influenza B Ag: NEGATIVE

## 2011-01-10 LAB — SEDIMENTATION RATE: Sed Rate: 74 — ABNORMAL HIGH

## 2011-01-10 LAB — INFLUENZA A & B ANTIBODIES
Influenza A Virus Ab, IgG: 0.37 IV
Influenza A Virus Ab, IgM: 0.08 IV
Influenza B Virus Ab, IgM: 0.05 IV
Influenza B ab, IgG: 0.1 IV

## 2011-01-10 LAB — AFB CULTURE WITH SMEAR (NOT AT ARMC)
Acid Fast Smear: NONE SEEN
Acid Fast Smear: NONE SEEN

## 2011-01-10 LAB — COMPREHENSIVE METABOLIC PANEL
ALT: 23
Calcium: 8.4
Creatinine, Ser: 0.48
GFR calc Af Amer: >60
GFR calc non Af Amer: >60
Glucose, Bld: 149 — ABNORMAL HIGH
Sodium: 138
Total Protein: 5.4 — ABNORMAL LOW

## 2011-01-10 LAB — CULTURE, RESPIRATORY W GRAM STAIN

## 2011-01-10 LAB — B-NATRIURETIC PEPTIDE (CONVERTED LAB): Pro B Natriuretic peptide (BNP): 34

## 2011-01-10 LAB — URINALYSIS, ROUTINE W REFLEX MICROSCOPIC
Glucose, UA: NEGATIVE
Leukocytes, UA: NEGATIVE
Protein, ur: 30 — AB
Specific Gravity, Urine: 1.017

## 2011-01-10 LAB — PROTIME-INR
INR: 1.5
Prothrombin Time: 18.3 — ABNORMAL HIGH

## 2011-01-10 LAB — GLOMERULAR BASEMENT MEMBRANE ANTIBODIES
GBM Ab: 0 AU/mL (ref 0–19)
GBM Ab: 0 AU/mL (ref 0–19)

## 2011-01-10 LAB — HISTOPLASMA ANTIGEN, URINE: Histoplasma Antigen Urine: NEGATIVE

## 2011-01-10 LAB — POCT I-STAT CREATININE: Creatinine, Ser: 0.7

## 2011-01-10 LAB — LEGIONELLA ANTIGEN, URINE: Legionella Antigen, Urine: NEGATIVE

## 2011-01-10 LAB — COMPLEMENT, TOTAL: Compl, Total (CH50): 62 U/mL (ref 31–66)

## 2011-01-10 LAB — PHOSPHORUS: Phosphorus: 4

## 2011-01-10 LAB — COLD AGGLUTININ TITER: Cold Agglutinin Titer: <1:32 {titer}

## 2011-01-10 LAB — LACTATE DEHYDROGENASE: LDH: 490 — ABNORMAL HIGH

## 2011-01-10 LAB — POCT PREGNANCY, URINE
Operator id: 277751
Preg Test, Ur: NEGATIVE

## 2011-01-10 LAB — CARDIAC PANEL(CRET KIN+CKTOT+MB+TROPI): Troponin I: 0.01

## 2011-01-10 LAB — RHEUMATOID FACTOR: Rhuematoid fact SerPl-aCnc: <20

## 2011-01-10 LAB — TSH: TSH: 0.467

## 2011-01-10 LAB — ANTI-NEUTROPHIL ANTIBODY: Cytoplasmic Neutrophilic Ab: <1:20 {titer}

## 2011-01-10 LAB — HIV ANTIBODY (ROUTINE TESTING W REFLEX): HIV: NONREACTIVE

## 2011-01-10 LAB — C-REACTIVE PROTEIN: CRP: 12.5 — ABNORMAL HIGH (ref <0.6)

## 2011-01-10 LAB — APTT: aPTT: 26

## 2011-01-13 LAB — COMPREHENSIVE METABOLIC PANEL
ALT: 38 — ABNORMAL HIGH
ALT: 72 — ABNORMAL HIGH
AST: 20
Albumin: 2.6 — ABNORMAL LOW
Albumin: 2.6 — ABNORMAL LOW
Alkaline Phosphatase: 55
Alkaline Phosphatase: 62
BUN: 4 — ABNORMAL LOW
Chloride: 97
GFR calc Af Amer: >60
Glucose, Bld: 171 — ABNORMAL HIGH
Potassium: 3.3 — ABNORMAL LOW
Potassium: 4.1
Sodium: 130 — ABNORMAL LOW
Sodium: 136
Total Bilirubin: 0.6
Total Protein: 5.2 — ABNORMAL LOW
Total Protein: 5.5 — ABNORMAL LOW

## 2011-01-13 LAB — POCT I-STAT 7, (LYTES, BLD GAS, ICA,H+H)
Calcium, Ion: 1.23
Hemoglobin: 11.2 — ABNORMAL LOW
O2 Saturation: 96
Potassium: 3.8
pCO2 arterial: 53.8 — ABNORMAL HIGH
pH, Arterial: 7.343 — ABNORMAL LOW
pO2, Arterial: 87

## 2011-01-13 LAB — TISSUE CULTURE

## 2011-01-13 LAB — AFB CULTURE WITH SMEAR (NOT AT ARMC)
Acid Fast Smear: NONE SEEN
Acid Fast Smear: NONE SEEN

## 2011-01-13 LAB — CBC
HCT: 30.9 — ABNORMAL LOW
HCT: 31.9 — ABNORMAL LOW
HCT: 32.3 — ABNORMAL LOW
HCT: 32.6 — ABNORMAL LOW
Hemoglobin: 10.8 — ABNORMAL LOW
Hemoglobin: 11.2 — ABNORMAL LOW
Hemoglobin: 11.3 — ABNORMAL LOW
Hemoglobin: 11.5 — ABNORMAL LOW
Hemoglobin: 11.8 — ABNORMAL LOW
Hemoglobin: 10.4 — ABNORMAL LOW
MCHC: 34.8
MCHC: 34.9
MCHC: 34.9
MCHC: 35
MCV: 96.7
MCV: 96.7
MCV: 97.7
Platelets: 179
Platelets: 256
Platelets: 264
Platelets: 277
Platelets: 288
Platelets: 247
RBC: 2.98 — ABNORMAL LOW
RBC: 3.19 — ABNORMAL LOW
RBC: 3.41 — ABNORMAL LOW
RBC: 3.43 — ABNORMAL LOW
RDW: 12.8
RDW: 13
RDW: 13.3
RDW: 13.4
RDW: 13.6
RDW: 13.7
RDW: 14.7
RDW: 14.3
WBC: 12.5 — ABNORMAL HIGH
WBC: 15 — ABNORMAL HIGH
WBC: 15.3 — ABNORMAL HIGH
WBC: 17 — ABNORMAL HIGH
WBC: 19.1 — ABNORMAL HIGH
WBC: 22.1 — ABNORMAL HIGH

## 2011-01-13 LAB — APTT: aPTT: 23 — ABNORMAL LOW

## 2011-01-13 LAB — BASIC METABOLIC PANEL
BUN: 11
BUN: 13
BUN: 18
BUN: 8
BUN: 9
CO2: 29
CO2: 32
CO2: 33 — ABNORMAL HIGH
Calcium: 8.2 — ABNORMAL LOW
Calcium: 8.3 — ABNORMAL LOW
Calcium: 8.4
Calcium: 8.7
Calcium: 9
Calcium: 9.1
Chloride: 101
Chloride: 97
Creatinine, Ser: 0.38 — ABNORMAL LOW
Creatinine, Ser: 0.39 — ABNORMAL LOW
Creatinine, Ser: 0.4
Creatinine, Ser: 0.42
Creatinine, Ser: 0.44
Creatinine, Ser: 0.45
Creatinine, Ser: 0.47
GFR calc Af Amer: >60
GFR calc Af Amer: >60
GFR calc Af Amer: >60
GFR calc Af Amer: >60
GFR calc Af Amer: >60
GFR calc Af Amer: >60
GFR calc non Af Amer: >60
GFR calc non Af Amer: >60
GFR calc non Af Amer: >60
GFR calc non Af Amer: >60
GFR calc non Af Amer: >60
GFR calc non Af Amer: >60
GFR calc non Af Amer: >60
Glucose, Bld: 128 — ABNORMAL HIGH
Glucose, Bld: 152 — ABNORMAL HIGH
Glucose, Bld: 77
Glucose, Bld: 96
Potassium: 3.4 — ABNORMAL LOW
Potassium: 4.2
Potassium: 4.3
Sodium: 133 — ABNORMAL LOW
Sodium: 135
Sodium: 136
Sodium: 137

## 2011-01-13 LAB — POCT I-STAT 3, ART BLOOD GAS (G3+)
Acid-Base Excess: 2
Bicarbonate: 26.6 — ABNORMAL HIGH
Operator id: 291371
Patient temperature: 98.2
pH, Arterial: 7.409 — ABNORMAL HIGH

## 2011-01-13 LAB — BODY FLUID CULTURE: Culture: NO GROWTH

## 2011-01-13 LAB — DIFFERENTIAL
Basophils Absolute: 0
Basophils Relative: 0
Lymphocytes Relative: 21
Monocytes Absolute: 0.6
Neutro Abs: 9.6 — ABNORMAL HIGH
Neutrophils Relative %: 68

## 2011-01-13 LAB — VIRUS CULTURE: Preliminary Culture: NEGATIVE

## 2011-01-13 LAB — HISTONE ANTIBODIES, IGG, BLOOD: DNA-Histone: <0.2 (ref <1.0)

## 2011-01-13 LAB — CYTOMEGALOVIRUS PCR, QUALITATIVE

## 2011-01-13 LAB — MAGNESIUM: Magnesium: 1.9

## 2011-01-13 LAB — BLOOD GAS, ARTERIAL
Drawn by: 244901
O2 Content: 4
O2 Saturation: 97.3
Patient temperature: 98.6

## 2011-01-13 LAB — TYPE AND SCREEN

## 2011-01-13 LAB — CYTOMEGALOVIRUS ANTIBODY, IGG: Cytomegalovirus(CMV) Antibody, IgG: NEGATIVE

## 2011-01-13 LAB — PHOSPHORUS: Phosphorus: 2.3

## 2011-01-13 LAB — FUNGUS CULTURE W SMEAR
Fungal Smear: NONE SEEN
Fungal Smear: NONE SEEN
Fungal Smear: NONE SEEN

## 2011-01-13 LAB — BORDETELLA PERTUSSIS ANTIBODY
B pertussis IgA Ab, Quant: 1 U
B pertussis IgG Ab, Quant: 16 U
B pertussis IgM Ab, Quant: 9

## 2011-01-20 LAB — POCT I-STAT, CHEM 8
BUN: 13
Calcium, Ion: 1.11 — ABNORMAL LOW
Chloride: 104
Potassium: 3.5
Sodium: 139

## 2011-01-20 LAB — COMPREHENSIVE METABOLIC PANEL
ALT: 14
AST: 12
Alkaline Phosphatase: 48
CO2: 25
Chloride: 107
GFR calc Af Amer: >60
GFR calc non Af Amer: >60
Glucose, Bld: 96
Potassium: 3.8
Sodium: 139

## 2011-01-20 LAB — TSH: TSH: 2.786

## 2011-01-20 LAB — PROTIME-INR
INR: 1.1
Prothrombin Time: 14.3
Prothrombin Time: 15

## 2011-01-20 LAB — VALPROIC ACID LEVEL
Valproic Acid Lvl: 144.3 — ABNORMAL HIGH
Valproic Acid Lvl: 191.7
Valproic Acid Lvl: 76.8

## 2011-01-20 LAB — ACETAMINOPHEN LEVEL: Acetaminophen (Tylenol), Serum: 11.9

## 2011-01-20 LAB — APTT: aPTT: 27

## 2011-01-20 LAB — RAPID URINE DRUG SCREEN, HOSP PERFORMED
Barbiturates: NOT DETECTED
Benzodiazepines: NOT DETECTED
Cocaine: NOT DETECTED

## 2011-01-20 LAB — T4: T4, Total: 9.2

## 2011-01-21 LAB — URINE DRUGS OF ABUSE SCREEN W ALC, ROUTINE (REF LAB)
Amphetamine Screen, Ur: NEGATIVE
Amphetamine Screen, Ur: NEGATIVE
Barbiturate Quant, Ur: NEGATIVE
Barbiturate Quant, Ur: NEGATIVE
Benzodiazepines.: NEGATIVE
Benzodiazepines.: NEGATIVE
Cocaine Metabolites: NEGATIVE
Cocaine Metabolites: NEGATIVE
Cocaine Metabolites: NEGATIVE
Creatinine,U: 100.6
Creatinine,U: 99.6
Ethyl Alcohol: <10
Ethyl Alcohol: <10
Ethyl Alcohol: <10
Marijuana Metabolite: NEGATIVE
Marijuana Metabolite: NEGATIVE
Opiate Screen, Urine: NEGATIVE
Opiate Screen, Urine: NEGATIVE
Opiate Screen, Urine: NEGATIVE
Opiate Screen, Urine: NEGATIVE
Phencyclidine (PCP): NEGATIVE
Phencyclidine (PCP): NEGATIVE
Propoxyphene: NEGATIVE
Propoxyphene: NEGATIVE
Propoxyphene: NEGATIVE

## 2011-01-23 ENCOUNTER — Ambulatory Visit: Payer: BC Managed Care – PPO | Admitting: Psychology

## 2011-02-20 ENCOUNTER — Ambulatory Visit (INDEPENDENT_AMBULATORY_CARE_PROVIDER_SITE_OTHER): Payer: BC Managed Care – PPO | Admitting: Psychology

## 2011-02-20 DIAGNOSIS — F4323 Adjustment disorder with mixed anxiety and depressed mood: Secondary | ICD-10-CM

## 2011-03-06 ENCOUNTER — Ambulatory Visit (INDEPENDENT_AMBULATORY_CARE_PROVIDER_SITE_OTHER): Payer: BC Managed Care – PPO | Admitting: Psychology

## 2011-03-06 DIAGNOSIS — F319 Bipolar disorder, unspecified: Secondary | ICD-10-CM

## 2011-03-06 DIAGNOSIS — F1010 Alcohol abuse, uncomplicated: Secondary | ICD-10-CM

## 2011-04-10 ENCOUNTER — Ambulatory Visit (INDEPENDENT_AMBULATORY_CARE_PROVIDER_SITE_OTHER): Payer: BC Managed Care – PPO | Admitting: Psychology

## 2011-04-10 DIAGNOSIS — F985 Adult onset fluency disorder: Secondary | ICD-10-CM

## 2011-04-10 DIAGNOSIS — F319 Bipolar disorder, unspecified: Secondary | ICD-10-CM

## 2011-05-14 ENCOUNTER — Ambulatory Visit: Payer: BC Managed Care – PPO | Admitting: Psychology

## 2011-05-29 ENCOUNTER — Ambulatory Visit (INDEPENDENT_AMBULATORY_CARE_PROVIDER_SITE_OTHER): Payer: BC Managed Care – PPO | Admitting: Psychology

## 2011-05-29 DIAGNOSIS — F319 Bipolar disorder, unspecified: Secondary | ICD-10-CM

## 2011-06-11 ENCOUNTER — Ambulatory Visit (INDEPENDENT_AMBULATORY_CARE_PROVIDER_SITE_OTHER): Payer: BC Managed Care – PPO | Admitting: Psychology

## 2011-06-11 DIAGNOSIS — F319 Bipolar disorder, unspecified: Secondary | ICD-10-CM

## 2011-06-11 DIAGNOSIS — F985 Adult onset fluency disorder: Secondary | ICD-10-CM

## 2011-06-26 ENCOUNTER — Ambulatory Visit (INDEPENDENT_AMBULATORY_CARE_PROVIDER_SITE_OTHER): Payer: BC Managed Care – PPO | Admitting: Psychology

## 2011-06-26 DIAGNOSIS — F319 Bipolar disorder, unspecified: Secondary | ICD-10-CM

## 2011-06-26 DIAGNOSIS — F985 Adult onset fluency disorder: Secondary | ICD-10-CM

## 2011-07-10 ENCOUNTER — Ambulatory Visit: Payer: BC Managed Care – PPO | Admitting: Psychology

## 2011-07-29 ENCOUNTER — Ambulatory Visit: Payer: BC Managed Care – PPO | Admitting: Psychology

## 2011-10-02 ENCOUNTER — Ambulatory Visit: Payer: BC Managed Care – PPO | Admitting: Psychology

## 2011-10-09 ENCOUNTER — Ambulatory Visit (INDEPENDENT_AMBULATORY_CARE_PROVIDER_SITE_OTHER): Payer: BC Managed Care – PPO | Admitting: Psychology

## 2011-10-09 DIAGNOSIS — F319 Bipolar disorder, unspecified: Secondary | ICD-10-CM

## 2011-10-09 DIAGNOSIS — F985 Adult onset fluency disorder: Secondary | ICD-10-CM

## 2011-10-16 ENCOUNTER — Ambulatory Visit: Payer: BC Managed Care – PPO | Admitting: Psychology

## 2011-11-12 ENCOUNTER — Ambulatory Visit (INDEPENDENT_AMBULATORY_CARE_PROVIDER_SITE_OTHER): Payer: BC Managed Care – PPO | Admitting: Psychology

## 2011-11-12 DIAGNOSIS — F319 Bipolar disorder, unspecified: Secondary | ICD-10-CM

## 2011-11-12 DIAGNOSIS — F985 Adult onset fluency disorder: Secondary | ICD-10-CM

## 2011-11-27 ENCOUNTER — Ambulatory Visit: Payer: BC Managed Care – PPO | Admitting: Psychology

## 2012-01-01 ENCOUNTER — Ambulatory Visit (INDEPENDENT_AMBULATORY_CARE_PROVIDER_SITE_OTHER): Payer: BC Managed Care – PPO | Admitting: Psychology

## 2012-01-01 DIAGNOSIS — F319 Bipolar disorder, unspecified: Secondary | ICD-10-CM

## 2012-01-01 DIAGNOSIS — F985 Adult onset fluency disorder: Secondary | ICD-10-CM

## 2012-05-12 ENCOUNTER — Ambulatory Visit: Payer: BC Managed Care – PPO | Admitting: Psychology

## 2012-08-12 ENCOUNTER — Ambulatory Visit: Payer: BC Managed Care – PPO | Admitting: Psychology

## 2012-09-16 ENCOUNTER — Ambulatory Visit (INDEPENDENT_AMBULATORY_CARE_PROVIDER_SITE_OTHER): Payer: BC Managed Care – PPO | Admitting: Psychology

## 2012-09-16 DIAGNOSIS — F5000 Anorexia nervosa, unspecified: Secondary | ICD-10-CM

## 2012-09-16 DIAGNOSIS — F319 Bipolar disorder, unspecified: Secondary | ICD-10-CM

## 2012-10-07 ENCOUNTER — Ambulatory Visit (INDEPENDENT_AMBULATORY_CARE_PROVIDER_SITE_OTHER): Payer: BC Managed Care – PPO | Admitting: Psychology

## 2012-10-07 DIAGNOSIS — F319 Bipolar disorder, unspecified: Secondary | ICD-10-CM

## 2012-10-07 DIAGNOSIS — F985 Adult onset fluency disorder: Secondary | ICD-10-CM

## 2012-10-21 ENCOUNTER — Ambulatory Visit (INDEPENDENT_AMBULATORY_CARE_PROVIDER_SITE_OTHER): Payer: BC Managed Care – PPO | Admitting: Psychology

## 2012-10-21 DIAGNOSIS — F985 Adult onset fluency disorder: Secondary | ICD-10-CM

## 2012-10-21 DIAGNOSIS — F319 Bipolar disorder, unspecified: Secondary | ICD-10-CM

## 2012-11-11 ENCOUNTER — Ambulatory Visit: Payer: BC Managed Care – PPO | Admitting: Psychology

## 2012-12-21 ENCOUNTER — Other Ambulatory Visit: Payer: Self-pay | Admitting: Obstetrics & Gynecology

## 2013-01-06 ENCOUNTER — Ambulatory Visit: Payer: BC Managed Care – PPO | Admitting: Psychology

## 2013-01-25 ENCOUNTER — Ambulatory Visit (INDEPENDENT_AMBULATORY_CARE_PROVIDER_SITE_OTHER): Payer: BC Managed Care – PPO | Admitting: Psychology

## 2013-01-25 DIAGNOSIS — F985 Adult onset fluency disorder: Secondary | ICD-10-CM

## 2013-01-25 DIAGNOSIS — F319 Bipolar disorder, unspecified: Secondary | ICD-10-CM

## 2013-02-10 ENCOUNTER — Ambulatory Visit (INDEPENDENT_AMBULATORY_CARE_PROVIDER_SITE_OTHER): Payer: BC Managed Care – PPO | Admitting: Psychology

## 2013-02-10 DIAGNOSIS — F319 Bipolar disorder, unspecified: Secondary | ICD-10-CM

## 2013-02-10 DIAGNOSIS — F985 Adult onset fluency disorder: Secondary | ICD-10-CM

## 2013-03-03 ENCOUNTER — Ambulatory Visit (INDEPENDENT_AMBULATORY_CARE_PROVIDER_SITE_OTHER): Payer: BC Managed Care – PPO | Admitting: Psychology

## 2013-03-03 DIAGNOSIS — F319 Bipolar disorder, unspecified: Secondary | ICD-10-CM

## 2013-03-03 DIAGNOSIS — F985 Adult onset fluency disorder: Secondary | ICD-10-CM

## 2013-03-24 ENCOUNTER — Ambulatory Visit (INDEPENDENT_AMBULATORY_CARE_PROVIDER_SITE_OTHER): Payer: BC Managed Care – PPO | Admitting: Psychology

## 2013-03-24 DIAGNOSIS — F319 Bipolar disorder, unspecified: Secondary | ICD-10-CM

## 2013-03-24 DIAGNOSIS — F985 Adult onset fluency disorder: Secondary | ICD-10-CM

## 2013-04-28 ENCOUNTER — Ambulatory Visit (INDEPENDENT_AMBULATORY_CARE_PROVIDER_SITE_OTHER): Payer: BC Managed Care – PPO | Admitting: Psychology

## 2013-04-28 DIAGNOSIS — F985 Adult onset fluency disorder: Secondary | ICD-10-CM

## 2013-04-28 DIAGNOSIS — F319 Bipolar disorder, unspecified: Secondary | ICD-10-CM

## 2013-05-12 ENCOUNTER — Ambulatory Visit (INDEPENDENT_AMBULATORY_CARE_PROVIDER_SITE_OTHER): Payer: BC Managed Care – PPO | Admitting: Psychology

## 2013-05-12 DIAGNOSIS — F319 Bipolar disorder, unspecified: Secondary | ICD-10-CM

## 2013-05-12 DIAGNOSIS — F5000 Anorexia nervosa, unspecified: Secondary | ICD-10-CM

## 2013-06-16 ENCOUNTER — Ambulatory Visit: Payer: BC Managed Care – PPO | Admitting: Psychology

## 2013-06-23 ENCOUNTER — Ambulatory Visit (INDEPENDENT_AMBULATORY_CARE_PROVIDER_SITE_OTHER): Payer: BC Managed Care – PPO | Admitting: Psychology

## 2013-06-23 DIAGNOSIS — F319 Bipolar disorder, unspecified: Secondary | ICD-10-CM

## 2013-06-23 DIAGNOSIS — F5000 Anorexia nervosa, unspecified: Secondary | ICD-10-CM

## 2013-07-07 ENCOUNTER — Ambulatory Visit (INDEPENDENT_AMBULATORY_CARE_PROVIDER_SITE_OTHER): Payer: BC Managed Care – PPO | Admitting: Psychology

## 2013-07-07 DIAGNOSIS — F319 Bipolar disorder, unspecified: Secondary | ICD-10-CM

## 2013-07-07 DIAGNOSIS — F4310 Post-traumatic stress disorder, unspecified: Secondary | ICD-10-CM

## 2013-07-21 ENCOUNTER — Ambulatory Visit: Payer: BC Managed Care – PPO | Admitting: Psychology

## 2013-07-26 ENCOUNTER — Ambulatory Visit (INDEPENDENT_AMBULATORY_CARE_PROVIDER_SITE_OTHER): Payer: BC Managed Care – PPO | Admitting: Psychology

## 2013-07-26 DIAGNOSIS — F319 Bipolar disorder, unspecified: Secondary | ICD-10-CM

## 2013-07-26 DIAGNOSIS — F5000 Anorexia nervosa, unspecified: Secondary | ICD-10-CM

## 2013-08-04 ENCOUNTER — Ambulatory Visit: Payer: BC Managed Care – PPO | Admitting: Psychology

## 2013-08-18 ENCOUNTER — Ambulatory Visit (INDEPENDENT_AMBULATORY_CARE_PROVIDER_SITE_OTHER): Payer: BC Managed Care – PPO | Admitting: Psychology

## 2013-08-18 DIAGNOSIS — F5000 Anorexia nervosa, unspecified: Secondary | ICD-10-CM

## 2013-08-18 DIAGNOSIS — F319 Bipolar disorder, unspecified: Secondary | ICD-10-CM

## 2013-09-08 ENCOUNTER — Ambulatory Visit: Payer: BC Managed Care – PPO | Admitting: Psychology

## 2013-10-04 ENCOUNTER — Ambulatory Visit (INDEPENDENT_AMBULATORY_CARE_PROVIDER_SITE_OTHER): Payer: BC Managed Care – PPO | Admitting: Psychology

## 2013-10-04 DIAGNOSIS — F5000 Anorexia nervosa, unspecified: Secondary | ICD-10-CM

## 2013-10-04 DIAGNOSIS — F319 Bipolar disorder, unspecified: Secondary | ICD-10-CM

## 2013-10-18 ENCOUNTER — Ambulatory Visit: Payer: BC Managed Care – PPO | Admitting: Psychology

## 2013-11-08 ENCOUNTER — Ambulatory Visit: Payer: BC Managed Care – PPO | Admitting: Psychology

## 2013-11-15 ENCOUNTER — Ambulatory Visit (INDEPENDENT_AMBULATORY_CARE_PROVIDER_SITE_OTHER): Payer: 59 | Admitting: Psychology

## 2013-11-15 DIAGNOSIS — F319 Bipolar disorder, unspecified: Secondary | ICD-10-CM

## 2013-11-15 DIAGNOSIS — F5000 Anorexia nervosa, unspecified: Secondary | ICD-10-CM

## 2013-11-29 ENCOUNTER — Ambulatory Visit (INDEPENDENT_AMBULATORY_CARE_PROVIDER_SITE_OTHER): Payer: 59 | Admitting: Psychology

## 2013-11-29 DIAGNOSIS — F319 Bipolar disorder, unspecified: Secondary | ICD-10-CM

## 2013-11-29 DIAGNOSIS — F5000 Anorexia nervosa, unspecified: Secondary | ICD-10-CM

## 2013-12-13 ENCOUNTER — Ambulatory Visit (INDEPENDENT_AMBULATORY_CARE_PROVIDER_SITE_OTHER): Payer: 59 | Admitting: Psychology

## 2013-12-13 DIAGNOSIS — F319 Bipolar disorder, unspecified: Secondary | ICD-10-CM

## 2013-12-13 DIAGNOSIS — F5000 Anorexia nervosa, unspecified: Secondary | ICD-10-CM

## 2013-12-27 ENCOUNTER — Ambulatory Visit: Payer: 59 | Admitting: Psychology

## 2014-01-10 ENCOUNTER — Ambulatory Visit (INDEPENDENT_AMBULATORY_CARE_PROVIDER_SITE_OTHER): Payer: 59 | Admitting: Psychology

## 2014-01-10 ENCOUNTER — Ambulatory Visit: Payer: 59 | Admitting: Psychology

## 2014-01-10 DIAGNOSIS — F5000 Anorexia nervosa, unspecified: Secondary | ICD-10-CM

## 2014-01-10 DIAGNOSIS — F319 Bipolar disorder, unspecified: Secondary | ICD-10-CM

## 2014-02-07 ENCOUNTER — Ambulatory Visit (INDEPENDENT_AMBULATORY_CARE_PROVIDER_SITE_OTHER): Payer: 59 | Admitting: Psychology

## 2014-02-07 DIAGNOSIS — F5000 Anorexia nervosa, unspecified: Secondary | ICD-10-CM

## 2014-02-07 DIAGNOSIS — F319 Bipolar disorder, unspecified: Secondary | ICD-10-CM

## 2014-02-21 ENCOUNTER — Ambulatory Visit: Payer: 59 | Admitting: Psychology

## 2014-03-07 ENCOUNTER — Ambulatory Visit (INDEPENDENT_AMBULATORY_CARE_PROVIDER_SITE_OTHER): Payer: 59 | Admitting: Psychology

## 2014-03-07 DIAGNOSIS — F5000 Anorexia nervosa, unspecified: Secondary | ICD-10-CM

## 2014-03-07 DIAGNOSIS — F319 Bipolar disorder, unspecified: Secondary | ICD-10-CM

## 2014-03-21 ENCOUNTER — Ambulatory Visit (INDEPENDENT_AMBULATORY_CARE_PROVIDER_SITE_OTHER): Payer: 59 | Admitting: Psychology

## 2014-03-21 DIAGNOSIS — F319 Bipolar disorder, unspecified: Secondary | ICD-10-CM

## 2014-03-21 DIAGNOSIS — F5000 Anorexia nervosa, unspecified: Secondary | ICD-10-CM

## 2014-05-02 ENCOUNTER — Ambulatory Visit: Payer: 59 | Admitting: Psychology

## 2014-05-16 ENCOUNTER — Ambulatory Visit (INDEPENDENT_AMBULATORY_CARE_PROVIDER_SITE_OTHER): Payer: 59 | Admitting: Psychology

## 2014-05-16 DIAGNOSIS — F5000 Anorexia nervosa, unspecified: Secondary | ICD-10-CM

## 2014-05-16 DIAGNOSIS — F319 Bipolar disorder, unspecified: Secondary | ICD-10-CM

## 2014-05-30 ENCOUNTER — Ambulatory Visit (INDEPENDENT_AMBULATORY_CARE_PROVIDER_SITE_OTHER): Payer: 59 | Admitting: Psychology

## 2014-05-30 DIAGNOSIS — F319 Bipolar disorder, unspecified: Secondary | ICD-10-CM

## 2014-06-13 ENCOUNTER — Ambulatory Visit: Payer: 59 | Admitting: Psychology

## 2014-06-27 ENCOUNTER — Ambulatory Visit (INDEPENDENT_AMBULATORY_CARE_PROVIDER_SITE_OTHER): Payer: 59 | Admitting: Psychology

## 2014-06-27 DIAGNOSIS — F5000 Anorexia nervosa, unspecified: Secondary | ICD-10-CM | POA: Diagnosis not present

## 2014-06-27 DIAGNOSIS — F319 Bipolar disorder, unspecified: Secondary | ICD-10-CM

## 2014-07-11 ENCOUNTER — Ambulatory Visit (INDEPENDENT_AMBULATORY_CARE_PROVIDER_SITE_OTHER): Payer: 59 | Admitting: Psychology

## 2014-07-11 DIAGNOSIS — F5000 Anorexia nervosa, unspecified: Secondary | ICD-10-CM

## 2014-07-11 DIAGNOSIS — F319 Bipolar disorder, unspecified: Secondary | ICD-10-CM | POA: Diagnosis not present

## 2014-07-25 ENCOUNTER — Ambulatory Visit (INDEPENDENT_AMBULATORY_CARE_PROVIDER_SITE_OTHER): Payer: 59 | Admitting: Psychology

## 2014-07-25 DIAGNOSIS — F5000 Anorexia nervosa, unspecified: Secondary | ICD-10-CM

## 2014-07-25 DIAGNOSIS — F319 Bipolar disorder, unspecified: Secondary | ICD-10-CM

## 2014-08-08 ENCOUNTER — Ambulatory Visit: Payer: 59 | Admitting: Psychology

## 2014-08-22 ENCOUNTER — Ambulatory Visit: Payer: 59 | Admitting: Psychology

## 2014-09-05 ENCOUNTER — Ambulatory Visit: Payer: 59 | Admitting: Psychology

## 2014-10-12 ENCOUNTER — Ambulatory Visit (INDEPENDENT_AMBULATORY_CARE_PROVIDER_SITE_OTHER): Payer: 59 | Admitting: Psychology

## 2014-10-12 DIAGNOSIS — F5000 Anorexia nervosa, unspecified: Secondary | ICD-10-CM | POA: Diagnosis not present

## 2014-10-12 DIAGNOSIS — F319 Bipolar disorder, unspecified: Secondary | ICD-10-CM

## 2014-11-02 ENCOUNTER — Ambulatory Visit: Payer: 59 | Admitting: Psychology

## 2014-11-16 ENCOUNTER — Ambulatory Visit: Payer: 59 | Admitting: Psychology

## 2014-11-30 ENCOUNTER — Ambulatory Visit: Payer: 59 | Admitting: Psychology

## 2014-12-14 ENCOUNTER — Ambulatory Visit: Payer: 59 | Admitting: Psychology

## 2014-12-28 ENCOUNTER — Ambulatory Visit: Payer: 59 | Admitting: Psychology

## 2017-01-01 DIAGNOSIS — F319 Bipolar disorder, unspecified: Secondary | ICD-10-CM | POA: Diagnosis not present

## 2017-01-08 DIAGNOSIS — F319 Bipolar disorder, unspecified: Secondary | ICD-10-CM | POA: Diagnosis not present

## 2017-01-22 DIAGNOSIS — F319 Bipolar disorder, unspecified: Secondary | ICD-10-CM | POA: Diagnosis not present

## 2017-02-05 DIAGNOSIS — R05 Cough: Secondary | ICD-10-CM | POA: Diagnosis not present

## 2017-02-05 DIAGNOSIS — J069 Acute upper respiratory infection, unspecified: Secondary | ICD-10-CM | POA: Diagnosis not present

## 2017-02-12 DIAGNOSIS — F319 Bipolar disorder, unspecified: Secondary | ICD-10-CM | POA: Diagnosis not present

## 2017-03-19 DIAGNOSIS — F319 Bipolar disorder, unspecified: Secondary | ICD-10-CM | POA: Diagnosis not present

## 2017-04-09 DIAGNOSIS — F319 Bipolar disorder, unspecified: Secondary | ICD-10-CM | POA: Diagnosis not present

## 2017-05-07 DIAGNOSIS — F319 Bipolar disorder, unspecified: Secondary | ICD-10-CM | POA: Diagnosis not present

## 2017-05-11 DIAGNOSIS — F319 Bipolar disorder, unspecified: Secondary | ICD-10-CM | POA: Diagnosis not present

## 2017-05-26 DIAGNOSIS — F319 Bipolar disorder, unspecified: Secondary | ICD-10-CM | POA: Diagnosis not present

## 2017-06-11 DIAGNOSIS — F319 Bipolar disorder, unspecified: Secondary | ICD-10-CM | POA: Diagnosis not present

## 2017-06-13 DIAGNOSIS — Z79899 Other long term (current) drug therapy: Secondary | ICD-10-CM | POA: Diagnosis not present

## 2017-06-18 DIAGNOSIS — F319 Bipolar disorder, unspecified: Secondary | ICD-10-CM | POA: Diagnosis not present

## 2017-06-25 DIAGNOSIS — F319 Bipolar disorder, unspecified: Secondary | ICD-10-CM | POA: Diagnosis not present

## 2017-07-30 DIAGNOSIS — F319 Bipolar disorder, unspecified: Secondary | ICD-10-CM | POA: Diagnosis not present

## 2017-08-06 DIAGNOSIS — F319 Bipolar disorder, unspecified: Secondary | ICD-10-CM | POA: Diagnosis not present

## 2017-09-03 DIAGNOSIS — F319 Bipolar disorder, unspecified: Secondary | ICD-10-CM | POA: Diagnosis not present

## 2017-09-17 DIAGNOSIS — F319 Bipolar disorder, unspecified: Secondary | ICD-10-CM | POA: Diagnosis not present

## 2017-10-01 DIAGNOSIS — F319 Bipolar disorder, unspecified: Secondary | ICD-10-CM | POA: Diagnosis not present

## 2017-10-28 DIAGNOSIS — Z79899 Other long term (current) drug therapy: Secondary | ICD-10-CM | POA: Diagnosis not present

## 2017-10-29 DIAGNOSIS — F319 Bipolar disorder, unspecified: Secondary | ICD-10-CM | POA: Diagnosis not present

## 2017-12-03 DIAGNOSIS — F319 Bipolar disorder, unspecified: Secondary | ICD-10-CM | POA: Diagnosis not present

## 2018-02-08 ENCOUNTER — Other Ambulatory Visit: Payer: Self-pay | Admitting: Obstetrics and Gynecology

## 2018-02-08 ENCOUNTER — Other Ambulatory Visit (HOSPITAL_COMMUNITY)
Admission: RE | Admit: 2018-02-08 | Discharge: 2018-02-08 | Disposition: A | Discharge status: Home / Self Care | Payer: BLUE CROSS/BLUE SHIELD | Source: Ambulatory Visit | Attending: Obstetrics and Gynecology | Admitting: Obstetrics and Gynecology

## 2018-02-08 DIAGNOSIS — Z3041 Encounter for surveillance of contraceptive pills: Secondary | ICD-10-CM | POA: Diagnosis not present

## 2018-02-08 DIAGNOSIS — Z01411 Encounter for gynecological examination (general) (routine) with abnormal findings: Secondary | ICD-10-CM | POA: Insufficient documentation

## 2018-02-08 DIAGNOSIS — R0789 Other chest pain: Secondary | ICD-10-CM | POA: Diagnosis not present

## 2018-02-08 DIAGNOSIS — G43829 Menstrual migraine, not intractable, without status migrainosus: Secondary | ICD-10-CM | POA: Diagnosis not present

## 2018-02-09 DIAGNOSIS — R0789 Other chest pain: Secondary | ICD-10-CM | POA: Diagnosis not present

## 2018-02-09 DIAGNOSIS — R509 Fever, unspecified: Secondary | ICD-10-CM | POA: Diagnosis not present

## 2018-02-10 LAB — CYTOLOGY - PAP
Diagnosis: NEGATIVE
HPV (WINDOPATH): NOT DETECTED

## 2018-02-11 DIAGNOSIS — F3131 Bipolar disorder, current episode depressed, mild: Secondary | ICD-10-CM | POA: Insufficient documentation

## 2018-02-11 DIAGNOSIS — F411 Generalized anxiety disorder: Secondary | ICD-10-CM | POA: Insufficient documentation

## 2018-02-11 DIAGNOSIS — F4310 Post-traumatic stress disorder, unspecified: Secondary | ICD-10-CM | POA: Insufficient documentation

## 2018-02-24 ENCOUNTER — Encounter: Payer: Self-pay | Admitting: Psychiatry

## 2018-02-24 ENCOUNTER — Ambulatory Visit: Payer: BLUE CROSS/BLUE SHIELD | Admitting: Psychiatry

## 2018-02-24 VITALS — BP 96/57 | HR 64

## 2018-02-24 DIAGNOSIS — F319 Bipolar disorder, unspecified: Secondary | ICD-10-CM | POA: Diagnosis not present

## 2018-02-24 DIAGNOSIS — F411 Generalized anxiety disorder: Secondary | ICD-10-CM

## 2018-02-24 DIAGNOSIS — F4310 Post-traumatic stress disorder, unspecified: Secondary | ICD-10-CM

## 2018-02-24 DIAGNOSIS — Z79899 Other long term (current) drug therapy: Secondary | ICD-10-CM

## 2018-02-24 MED ORDER — FLUOXETINE HCL 20 MG PO CAPS: 20.0000 mg | ORAL_CAPSULE | Freq: Every day | ORAL | 5 refills | Status: DC

## 2018-02-24 MED ORDER — BUSPIRONE HCL 15 MG PO TABS: 22.5000 mg | ORAL_TABLET | Freq: Two times a day (BID) | ORAL | 5 refills | Status: DC

## 2018-02-24 MED ORDER — LITHIUM CARBONATE ER 300 MG PO TBCR: 900.0000 mg | EXTENDED_RELEASE_TABLET | Freq: Every day | ORAL | 5 refills | Status: DC
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
## 2018-03-04 NOTE — Progress Notes (Signed)
Left voicemail with detailed information 

## 2018-03-08 DIAGNOSIS — N390 Urinary tract infection, site not specified: Secondary | ICD-10-CM | POA: Diagnosis not present

## 2018-03-10 ENCOUNTER — Ambulatory Visit: Payer: BLUE CROSS/BLUE SHIELD | Admitting: Psychiatry

## 2018-03-11 ENCOUNTER — Telehealth: Payer: Self-pay

## 2018-03-11 ENCOUNTER — Other Ambulatory Visit: Payer: Self-pay

## 2018-03-11 NOTE — Telephone Encounter (Signed)
-----   Message from Corie ChiquitoJessica Carter, PMHNP sent at 02/26/2018  9:25 AM EST ----- Please call pt and let her know I would like for her to have labs drawn prior to next appointment to include: Lithium level, basic metabolic panel, and TSH to determine drug level and assess for any potential adverse effects. If she wants to have it done around the first of the year, that would be good. Please let her knows lab order sent to [facility].

## 2018-03-12 NOTE — Telephone Encounter (Signed)
Trying to reach Pt

## 2018-03-31 ENCOUNTER — Ambulatory Visit: Payer: BLUE CROSS/BLUE SHIELD | Admitting: Psychiatry

## 2018-05-26 ENCOUNTER — Ambulatory Visit: Payer: BLUE CROSS/BLUE SHIELD | Admitting: Psychiatry

## 2018-06-23 ENCOUNTER — Encounter: Payer: Self-pay | Admitting: Psychiatry

## 2018-06-23 ENCOUNTER — Ambulatory Visit: Payer: BLUE CROSS/BLUE SHIELD | Admitting: Psychiatry

## 2018-06-23 DIAGNOSIS — F411 Generalized anxiety disorder: Secondary | ICD-10-CM

## 2018-06-23 DIAGNOSIS — F319 Bipolar disorder, unspecified: Secondary | ICD-10-CM

## 2018-06-23 DIAGNOSIS — F4310 Post-traumatic stress disorder, unspecified: Secondary | ICD-10-CM | POA: Diagnosis not present
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
## 2018-07-26 ENCOUNTER — Other Ambulatory Visit: Payer: Self-pay

## 2018-07-26 ENCOUNTER — Encounter: Payer: Self-pay | Admitting: Psychiatry

## 2018-07-26 ENCOUNTER — Ambulatory Visit (INDEPENDENT_AMBULATORY_CARE_PROVIDER_SITE_OTHER): Payer: BLUE CROSS/BLUE SHIELD | Admitting: Psychiatry

## 2018-07-26 DIAGNOSIS — F4310 Post-traumatic stress disorder, unspecified: Secondary | ICD-10-CM

## 2018-07-26 DIAGNOSIS — F411 Generalized anxiety disorder: Secondary | ICD-10-CM

## 2018-07-26 DIAGNOSIS — F319 Bipolar disorder, unspecified: Secondary | ICD-10-CM | POA: Diagnosis not present
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
## 2018-08-11 ENCOUNTER — Ambulatory Visit (INDEPENDENT_AMBULATORY_CARE_PROVIDER_SITE_OTHER): Payer: BLUE CROSS/BLUE SHIELD | Admitting: Psychiatry

## 2018-08-11 ENCOUNTER — Other Ambulatory Visit: Payer: Self-pay

## 2018-08-11 DIAGNOSIS — F4310 Post-traumatic stress disorder, unspecified: Secondary | ICD-10-CM | POA: Diagnosis not present

## 2018-08-11 DIAGNOSIS — F411 Generalized anxiety disorder: Secondary | ICD-10-CM

## 2018-08-11 DIAGNOSIS — F319 Bipolar disorder, unspecified: Secondary | ICD-10-CM | POA: Diagnosis not present

## 2018-08-11 NOTE — Progress Notes (Signed)
[facility] Counselor/Therapist Progress Note  Patient ID: [name], MRN: [id number],    Date: 08/12/2018  Time Spent: 46 minutes start time 8:12 a.m. and time 8:58 AM Virtual Visit via Telephone Note I connected with patient by a video enabled telemedicine application or telephone, with their informed consent, and verified patient privacy and that I am speaking with the correct person using two identifiers. I discussed the limitations, risks, security and privacy concerns of performing psychotherapy and management service by telephone and the availability of in person appointments. I also discussed with the patient that there may be a patient responsible charge related to this service. The patient expressed understanding and agreed to proceed. I discussed the treatment planning with the patient. The patient was provided an opportunity to ask questions and all were answered. The patient agreed with the plan and demonstrated an understanding of the instructions. The patient was advised to call  our office if  symptoms worsen or feel they are in a crisis state and need immediate contact. Patient was at her boyfriend's home and clinician was at her home  Treatment Type: Individual Therapy  Reported Symptoms: sleep issues, mood issues, crying spells, fatigue, motivation issues, anxiety  Mental Status Exam:  Appearance:   NA     Behavior:  Sharing  Motor:  NA  Speech/Language:   Normal Rate  Affect:  NA  Mood:  sad  Thought process:  normal  Thought content:    WNL  Sensory/Perceptual disturbances:    WNL  Orientation:  oriented to person, place, time/date and situation  Attention:  Good  Concentration:  Good  Memory:  WNL  Fund of knowledge:   Good  Insight:    Fair  Judgment:   Good  Impulse Control:  Good   Risk Assessment: Danger to Self:  No Self-injurious Behavior: No Danger to Others: No Duty to Warn:no Physical Aggression / Violence:No  Access to Firearms a  concern: No  Gang Involvement:No   Subjective: Met with patient via phone.  Patient reported she has been struggling recently with her mood and motivation.  Patient explained she is feeling that the situation is starting to get to her and she is feeling more isolated and that this is going to continue for a long time.  Patient was encouraged to look at the patterns for the week and what she has and has not done to see if there are any changes from previous weeks when she was seeming to function better.  Patient admitted she is probably not having as zoom many using contacts and has not been exercising as much as she was.  Patient also shared that she knows her schoolwork is about over and not having anything to focus on to complete makes motivation more difficult for her.  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  Different ideas to start helping her organize her life make that time functional was discussed with patient.  CBT skills to try and talk herself through staying on task and work on her self-care were discussed with patient.  Patient agreed to get back connecting more with her groups and her friends on line.  She is also going to talk to her boyfriend about taking more walks and getting more outside time.  Patient agreed to try plans from session but  if things are to go in a negative direction she will contact office and schedule an earlier appointment.   Interventions: Cognitive Behavioral Therapy and Solution-Oriented/Positive Psychology  Diagnosis:   ICD-10-CM   1. PTSD (post-traumatic stress disorder) F43.10   2. Bipolar I disorder ([place]) F31.9   3. Generalized anxiety disorder F41.1     Plan: 1.  Patient to continue to engage in individual counseling 2-4 times a month or as needed. 2.  Patient to identify and apply CBT, coping skills learned in  session to decrease depression and anxiety symptoms. 3.  Patient to contact this office, go to the local ED or call 911 if a crisis or emergency develops between visits.  Lina Sayre, [facility]  This record has been created using [employer].  Chart creation errors have been sought, but may not always have been located and corrected. Such creation errors do not reflect on the standard of medical care.

## 2018-08-12 ENCOUNTER — Encounter: Payer: Self-pay | Admitting: Psychiatry

## 2018-08-17 ENCOUNTER — Other Ambulatory Visit: Payer: Self-pay

## 2018-08-17 ENCOUNTER — Ambulatory Visit (INDEPENDENT_AMBULATORY_CARE_PROVIDER_SITE_OTHER): Payer: BLUE CROSS/BLUE SHIELD | Admitting: Psychiatry

## 2018-08-17 ENCOUNTER — Encounter: Payer: Self-pay | Admitting: Psychiatry

## 2018-08-17 DIAGNOSIS — Z79899 Other long term (current) drug therapy: Secondary | ICD-10-CM | POA: Diagnosis not present

## 2018-08-17 DIAGNOSIS — F411 Generalized anxiety disorder: Secondary | ICD-10-CM | POA: Diagnosis not present

## 2018-08-17 DIAGNOSIS — F4310 Post-traumatic stress disorder, unspecified: Secondary | ICD-10-CM | POA: Diagnosis not present

## 2018-08-17 DIAGNOSIS — F99 Mental disorder, not otherwise specified: Secondary | ICD-10-CM

## 2018-08-17 DIAGNOSIS — F319 Bipolar disorder, unspecified: Secondary | ICD-10-CM | POA: Diagnosis not present

## 2018-08-17 DIAGNOSIS — F5105 Insomnia due to other mental disorder: Secondary | ICD-10-CM

## 2018-08-17 MED ORDER — LITHIUM CARBONATE ER 300 MG PO TBCR: 900.0000 mg | EXTENDED_RELEASE_TABLET | Freq: Every day | ORAL | 5 refills | Status: DC

## 2018-08-17 MED ORDER — HYDROXYZINE HCL 25 MG PO TABS: 25.0000 mg | ORAL_TABLET | Freq: Four times a day (QID) | ORAL | 2 refills | Status: DC | PRN

## 2018-08-17 MED ORDER — FLUOXETINE HCL 20 MG PO CAPS: 20.0000 mg | ORAL_CAPSULE | Freq: Every day | ORAL | 5 refills | Status: DC

## 2018-08-17 NOTE — Progress Notes (Signed)
[name] DroughtShandale D [name] [id number] [birth year] 33 y.o.  Virtual Visit via Telephone Note  I connected with@ on 08/17/18 at  9:00 AM EDT by telephone and verified that I am speaking with the correct person using two identifiers.   I discussed the limitations, risks, security and privacy concerns of performing an evaluation and management service by telephone and the availability of in person appointments. I also discussed with the patient that there may be a patient responsible charge related to this service. The patient expressed understanding and agreed to proceed.   I discussed the assessment and treatment plan with the patient. The patient was provided an opportunity to ask questions and all were answered. The patient agreed with the plan and demonstrated an understanding of the instructions.   The patient was advised to call back or seek an in-person evaluation if the symptoms worsen or if the condition fails to improve as anticipated.  I provided 30 minutes of non-face-to-face time during this encounter.  The patient was located at home.  The provider was located at home.   Corie ChiquitoJessica Jamario Colina, PMHNP   Subjective:   Patient ID:  [name] DroughtShandale D Palos is a 33 y.o. (DOB [birth year]) female.  Chief Complaint: No chief complaint on file.   HPI [name] DroughtShandale D Urizar presents for follow-up of anxiety. Depression, and sleep disturbance. She reports "Overall my mood has been pretty good." She reports that she has been experiencing some increased anxiety recently. Reports that she has been taking Vistaril prn for anxiety and insomnia since she has been having trouble with sleep. Reports that she has been experiencing some "restlessness." She reports that her social anxiety has been heightened recently with pandemic and that she has continued to go grocery shopping and do necessary errands. Reports that she recently had a panic attack at home and had the start of panic at the grocery store. Reports generalized  anxiety has been "up and down." Reports that her motivation has been slightly low with pandemic. Reports that her energy has been "up and down" and more tired during the day and energized at night. She reports that her appetite has been less than normal but continues to eat an adequate amount. Reports that she may have lost a few pounds. She reports that her concentration has been "lower but it hasn't been terrible." She reports that she has done ok academically. Denies SI.    Has been having nightmares every night.  Denies any recent manic s/s.   Reports that she has just finished school remotely. She plans to return to work part-time on 08/28/18 at [employer] and has "mixed feelings" about this. Reports that she has been enjoying spending time with her boyfriend. Reports that she has been attending AA meetings with Zoom. Has also been journaling and meditating.   Review of Systems:  Review of Systems  Musculoskeletal: Negative for gait problem.  Neurological: Negative for tremors.  Psychiatric/Behavioral:       Please refer to HPI    Medications: I have reviewed the patient's current medications.  Current Outpatient Medications  Medication Sig Dispense Refill  . hydrOXYzine (ATARAX/VISTARIL) 25 MG tablet Take 1 tablet (25 mg total) by mouth every 6 (six) hours as needed for up to 30 days. 90 tablet 2  . Norgestimate-Ethinyl Estradiol Triphasic (ORTHO TRI-CYCLEN LO) 0.18/0.215/0.25 MG-25 MCG tab Take 1 tablet by mouth daily.    Marland Kitchen. FLUoxetine (PROZAC) 20 MG capsule Take 1 capsule (20 mg total) by mouth daily for 30 days. 30 capsule 5  .  lithium carbonate (LITHOBID) 300 MG CR tablet Take 3 tablets (900 mg total) by mouth at bedtime for 30 days. 90 tablet 5   No current facility-administered medications for this visit.     Medication Side Effects: None  Allergies:  Allergies  Allergen Reactions  . Lamictal [Lamotrigine]   . Sulfa Antibiotics     No past medical history on  file.  Family History  Problem Relation Age of Onset  . Drug abuse Maternal Aunt   . Drug abuse Maternal Uncle   . Alcohol abuse Maternal Grandfather   . Alcohol abuse Maternal Grandmother   . Drug abuse Cousin     Social History   Socioeconomic History  . Marital status: Single    Spouse name: Not on file  . Number of children: Not on file  . Years of education: Not on file  . Highest education level: Not on file  Occupational History  . Not on file  Social Needs  . Financial resource strain: Not on file  . Food insecurity:    Worry: Not on file    Inability: Not on file  . Transportation needs:    Medical: Not on file    Non-medical: Not on file  Tobacco Use  . Smoking status: Former Games developer  . Smokeless tobacco: Never Used  Substance and Sexual Activity  . Alcohol use: Not on file  . Drug use: Not on file  . Sexual activity: Not on file  Lifestyle  . Physical activity:    Days per week: Not on file    Minutes per session: Not on file  . Stress: Not on file  Relationships  . Social connections:    Talks on phone: Not on file    Gets together: Not on file    Attends religious service: Not on file    Active member of club or organization: Not on file    Attends meetings of clubs or organizations: Not on file    Relationship status: Not on file  . Intimate partner violence:    Fear of current or ex partner: Not on file    Emotionally abused: Not on file    Physically abused: Not on file    Forced sexual activity: Not on file  Other Topics Concern  . Not on file  Social History Narrative  . Not on file    Past Medical History, Surgical history, Social history, and Family history were reviewed and updated as appropriate.   Please see review of systems for further details on the patient's review from today.   Objective:   Physical Exam:  There were no vitals taken for this visit.  Physical Exam Neurological:     Mental Status: She is alert and  oriented to person, place, and time.     Cranial Nerves: No dysarthria.  Psychiatric:        Attention and Perception: Attention normal.        Mood and Affect: Mood is not depressed.        Speech: Speech normal.        Behavior: Behavior is cooperative.        Thought Content: Thought content normal. Thought content is not paranoid or delusional. Thought content does not include homicidal or suicidal ideation. Thought content does not include homicidal or suicidal plan.        Cognition and Memory: Cognition and memory normal.        Judgment: Judgment normal.  Comments: Mood presents as mildly anxious.     Lab Review:     Component Value Date/Time   NA 139 11/21/2010 0606   K 3.1 (L) 11/21/2010 0606   CL 106 11/21/2010 0606   CO2 27 11/21/2010 0606   GLUCOSE 94 11/21/2010 0606   BUN 12 11/21/2010 0606   CREATININE <0.47 (L) 11/21/2010 0606   CALCIUM 8.5 11/21/2010 0606   PROT 5.5 (L) 11/21/2010 0606   ALBUMIN 2.8 (L) 11/21/2010 0606   AST 17 11/21/2010 0606   ALT 9 11/21/2010 0606   ALKPHOS 54 11/21/2010 0606   BILITOT 0.3 11/21/2010 0606   GFRNONAA NOT CALCULATED 11/21/2010 0606   GFRAA NOT CALCULATED 11/21/2010 0606       Component Value Date/Time   WBC 6.0 11/21/2010 0606   RBC 3.51 (L) 11/21/2010 0606   HGB 11.3 (L) 11/21/2010 0606   HCT 33.1 (L) 11/21/2010 0606   PLT 137 (L) 11/21/2010 0606   MCV 94.3 11/21/2010 0606   MCH 32.2 11/21/2010 0606   MCHC 34.1 11/21/2010 0606   RDW 12.2 11/21/2010 0606   LYMPHSABS 2.2 11/18/2010 1246   MONOABS 0.6 11/18/2010 1246   EOSABS 0.1 11/18/2010 1246   BASOSABS 0.0 11/18/2010 1246    No results found for: POCLITH, LITHIUM   Lab Results  Component Value Date   VALPROATE 90.7 11/21/2010     .res Assessment: Plan:   Discussed obtaining labs to monitor for potential adverse effects of lithium and to determine current blood level.  Discussed that labs would be resent to [facility] and to call office with  any issues.  Will order lithium level, BMP, and TSH.  Will resume hydroxyzine 25 mg as needed insomnia or anxiety since patient reports that this has recently been helpful. Continue lithium 900 mg p.o. nightly for mood stabilization Continue Prozac 20 mg daily for depression and anxiety. Recommend continuing psychotherapy with Stevphen Meuse, LPC. Recommend follow-up with this provider in 3 months or sooner if clinically indicated.  Bipolar I disorder (HCC) - Plan: lithium carbonate (LITHOBID) 300 MG CR tablet  High risk medication use - Plan: Basic metabolic panel, Lithium level, TSH  PTSD (post-traumatic stress disorder) - Plan: FLUoxetine (PROZAC) 20 MG capsule  Generalized anxiety disorder - Plan: FLUoxetine (PROZAC) 20 MG capsule, hydrOXYzine (ATARAX/VISTARIL) 25 MG tablet  Insomnia due to other mental disorder - Plan: hydrOXYzine (ATARAX/VISTARIL) 25 MG tablet  Please see After Visit Summary for patient specific instructions.  Future Appointments  Date Time Provider Department Center  11/16/2018  3:15 PM Corie Chiquito, PMHNP [facility] None    Orders Placed This Encounter  Procedures  . Basic metabolic panel  . Lithium level  . TSH      -------------------------------

## 2018-11-05 ENCOUNTER — Ambulatory Visit: Payer: BLUE CROSS/BLUE SHIELD | Admitting: Psychiatry

## 2018-11-16 ENCOUNTER — Ambulatory Visit (INDEPENDENT_AMBULATORY_CARE_PROVIDER_SITE_OTHER): Payer: BC Managed Care – PPO | Admitting: Psychiatry

## 2018-11-16 ENCOUNTER — Encounter: Payer: Self-pay | Admitting: Psychiatry

## 2018-11-16 ENCOUNTER — Other Ambulatory Visit: Payer: Self-pay

## 2018-11-16 VITALS — Wt 153.0 lb

## 2018-11-16 DIAGNOSIS — Z79899 Other long term (current) drug therapy: Secondary | ICD-10-CM

## 2018-11-16 DIAGNOSIS — F4310 Post-traumatic stress disorder, unspecified: Secondary | ICD-10-CM

## 2018-11-16 DIAGNOSIS — F319 Bipolar disorder, unspecified: Secondary | ICD-10-CM

## 2018-11-16 DIAGNOSIS — F411 Generalized anxiety disorder: Secondary | ICD-10-CM

## 2018-11-16 MED ORDER — BUSPIRONE HCL 15 MG PO TABS: 22.5000 mg | ORAL_TABLET | Freq: Two times a day (BID) | ORAL | 5 refills | Status: DC

## 2018-11-16 MED ORDER — FLUOXETINE HCL 20 MG PO CAPS: 20.0000 mg | ORAL_CAPSULE | Freq: Every day | ORAL | 5 refills | Status: DC

## 2018-11-16 NOTE — Progress Notes (Signed)
[name] [id number] [birth year] 33 y.o.  Subjective:   Patient ID:  [name] is a 33 y.o. (DOB [birth year]) female.  Chief Complaint:  Chief Complaint  Patient presents with  . Follow-up    h/o PTSD and Bipolar D/O    HPI [name] On presents to the office today for follow-up of anxiety and depression. She reports that she has noticed some depression recently and worsened a couple of weeks ago and seemed to improve over the last few days. She reports that she was more tearful and lethargic. Cannot identify specific trigger. Notices more energy today. Reports that anxiety has been very manageable lately. Sleeping ok and reports that she has had an erratic sleep schedule and reports sleeping an adequate amount or "too much." Infrequent nightmares. Appetite has been lower. Motivation has been good today and has been lower recently. Some difficulty with concentration. Reports feeling "foggy" at times. Denies SI.   Denies any recent manic s/s.  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 Reports that she took severance from [employer]. Has been participating in [employer] meetings on Zoom.   Past medication trials: Depakote-effective.  Tremor Lithium-effective Lamictal-rash Abilify Seroquel-effective.  Caused weight gain. Symbyax Latuda Saphris-adverse reaction Rexulti-ineffective Prozac BuSpar-effective for anxiety Vistaril  Review of Systems:  Review of Systems  Gastrointestinal:       Reports diarrhea and decreased app last week (resolved)  Musculoskeletal: Negative for gait problem.  Neurological: Negative for tremors.  Psychiatric/Behavioral:       Please refer to HPI    Medications: I have reviewed the patient's current medications.  Current Outpatient Medications  Medication Sig Dispense Refill  . Norgestimate-Ethinyl Estradiol Triphasic (ORTHO TRI-CYCLEN LO) 0.18/0.215/0.25 MG-25 MCG tab  Take 1 tablet by mouth daily.    . busPIRone (BUSPAR) 15 MG tablet Take 1.5 tablets (22.5 mg total) by mouth 2 (two) times daily. 90 tablet 5  . busPIRone (BUSPAR) 15 MG tablet Take 1.5 tablets (22.5 mg total) by mouth 2 (two) times a day. 90 tablet 5  . FLUoxetine (PROZAC) 20 MG capsule Take 1 capsule (20 mg total) by mouth daily. 30 capsule 5  . hydrOXYzine (ATARAX/VISTARIL) 25 MG tablet Take 1 tablet (25 mg total) by mouth every 6 (six) hours as needed for up to 30 days. 90 tablet 2  . lithium carbonate (LITHOBID) 300 MG CR tablet Take 3 tablets (900 mg total) by mouth at bedtime for 30 days. 90 tablet 5   No current facility-administered medications for this visit.     Medication Side Effects: None  Allergies:  Allergies  Allergen Reactions  . Lamictal [Lamotrigine]   . Sulfa Antibiotics     History reviewed. No pertinent past medical history.  Family History  Problem Relation Age of Onset  . Drug abuse Maternal Aunt   . Drug abuse Maternal Uncle   . Alcohol abuse Maternal Grandfather   . Alcohol abuse Maternal Grandmother   . Drug abuse Cousin     Social History   Socioeconomic History  . Marital status: Single    Spouse name: Not on file  . Number of children: Not on file  . Years of education: Not on file  . Highest education level: Not on file  Occupational History  . Not on file  Social Needs  . Financial resource strain: Not on file  . Food insecurity    Worry: Not on file  Inability: Not on file  . Transportation needs    Medical: Not on file    Non-medical: Not on file  Tobacco Use  . Smoking status: Former Games developermoker  . Smokeless tobacco: Never Used  Substance and Sexual Activity  . Alcohol use: Not on file  . Drug use: Not on file  . Sexual activity: Not on file  Lifestyle  . Physical activity    Days per week: Not on file    Minutes per session: Not on file  . Stress: Not on file  Relationships  . Social Musicianconnections    Talks on phone: Not on  file    Gets together: Not on file    Attends religious service: Not on file    Active member of club or organization: Not on file    Attends meetings of clubs or organizations: Not on file    Relationship status: Not on file  . Intimate partner violence    Fear of current or ex partner: Not on file    Emotionally abused: Not on file    Physically abused: Not on file    Forced sexual activity: Not on file  Other Topics Concern  . Not on file  Social History Narrative  . Not on file    Past Medical History, Surgical history, Social history, and Family history were reviewed and updated as appropriate.   Please see review of systems for further details on the patient's review from today.   Objective:   Physical Exam:  Wt 153 lb (69.4 kg)   Physical Exam Constitutional:      General: She is not in acute distress.    Appearance: She is well-developed.  Musculoskeletal:        General: No deformity.  Neurological:     Mental Status: She is alert and oriented to person, place, and time.     Coordination: Coordination normal.  Psychiatric:        Attention and Perception: Attention and perception normal. She does not perceive auditory or visual hallucinations.        Mood and Affect: Mood normal. Mood is not anxious or depressed. Affect is blunt. Affect is not labile, angry or inappropriate.        Speech: Speech normal.        Behavior: Behavior normal.        Thought Content: Thought content normal. Thought content does not include homicidal or suicidal ideation. Thought content does not include homicidal or suicidal plan.        Cognition and Memory: Cognition and memory normal.        Judgment: Judgment normal.     Comments: Insight intact. No delusions.      Lab Review:     Component Value Date/Time   NA 139 11/21/2010 0606   K 3.1 (L) 11/21/2010 0606   CL 106 11/21/2010 0606   CO2 27 11/21/2010 0606   GLUCOSE 94 11/21/2010 0606   BUN 12 11/21/2010 0606    CREATININE <0.47 (L) 11/21/2010 0606   CALCIUM 8.5 11/21/2010 0606   PROT 5.5 (L) 11/21/2010 0606   ALBUMIN 2.8 (L) 11/21/2010 0606   AST 17 11/21/2010 0606   ALT 9 11/21/2010 0606   ALKPHOS 54 11/21/2010 0606   BILITOT 0.3 11/21/2010 0606   GFRNONAA NOT CALCULATED 11/21/2010 0606   GFRAA NOT CALCULATED 11/21/2010 0606       Component Value Date/Time   WBC 6.0 11/21/2010 0606   RBC 3.51 (L) 11/21/2010  0606   HGB 11.3 (L) 11/21/2010 0606   HCT 33.1 (L) 11/21/2010 0606   PLT 137 (L) 11/21/2010 0606   MCV 94.3 11/21/2010 0606   MCH 32.2 11/21/2010 0606   MCHC 34.1 11/21/2010 0606   RDW 12.2 11/21/2010 0606   LYMPHSABS 2.2 11/18/2010 1246   MONOABS 0.6 11/18/2010 1246   EOSABS 0.1 11/18/2010 1246   BASOSABS 0.0 11/18/2010 1246    No results found for: POCLITH, LITHIUM   Lab Results  Component Value Date   VALPROATE 90.7 11/21/2010     .res Assessment: Plan:   Will continue current medications since patient reports that overall mood and anxiety signs and symptoms have been well controlled with current medications and medications have been well-tolerated. Discussed importance of obtaining labs to monitor for potential adverse effects with lithium.  Patient provided with lab requisition for lithium level, BMP, and TSH. Patient to follow-up in 3 months or sooner if clinically indicated. Patient advised to contact office with any questions, adverse effects, or acute worsening in signs and symptoms.  Adalia was seen today for follow-up.  Diagnoses and all orders for this visit:  High risk medication use -     Lithium level -     TSH -     Basic metabolic panel  PTSD (post-traumatic stress disorder) -     busPIRone (BUSPAR) 15 MG tablet; Take 1.5 tablets (22.5 mg total) by mouth 2 (two) times a day. -     FLUoxetine (PROZAC) 20 MG capsule; Take 1 capsule (20 mg total) by mouth daily.  Generalized anxiety disorder -     busPIRone (BUSPAR) 15 MG tablet; Take 1.5 tablets  (22.5 mg total) by mouth 2 (two) times a day. -     FLUoxetine (PROZAC) 20 MG capsule; Take 1 capsule (20 mg total) by mouth daily.  Bipolar I disorder ([place])     Please see After Visit Summary for patient specific instructions.  Future Appointments  Date Time Provider [place]  02/16/2019  9:30 AM Thayer Headings, PMHNP [facility] None    Orders Placed This Encounter  Procedures  . Lithium level  . TSH  . Basic metabolic panel    -------------------------------

## 2019-02-02 DIAGNOSIS — Z20828 Contact with and (suspected) exposure to other viral communicable diseases: Secondary | ICD-10-CM | POA: Diagnosis not present

## 2019-02-02 DIAGNOSIS — R519 Headache, unspecified: Secondary | ICD-10-CM | POA: Diagnosis not present

## 2019-02-02 DIAGNOSIS — M791 Myalgia, unspecified site: Secondary | ICD-10-CM | POA: Diagnosis not present

## 2019-02-16 ENCOUNTER — Ambulatory Visit: Payer: BC Managed Care – PPO | Admitting: Psychiatry

## 2019-03-10 ENCOUNTER — Other Ambulatory Visit: Payer: Self-pay

## 2019-03-10 DIAGNOSIS — Z20822 Contact with and (suspected) exposure to covid-19: Secondary | ICD-10-CM

## 2019-03-14 LAB — NOVEL CORONAVIRUS, NAA: SARS-CoV-2, NAA: NOT DETECTED

## 2019-04-10 ENCOUNTER — Other Ambulatory Visit: Payer: Self-pay | Admitting: Psychiatry

## 2019-04-10 DIAGNOSIS — F319 Bipolar disorder, unspecified: Secondary | ICD-10-CM

## 2019-05-04 ENCOUNTER — Other Ambulatory Visit: Payer: Self-pay | Admitting: Psychiatry

## 2019-05-04 DIAGNOSIS — Z20828 Contact with and (suspected) exposure to other viral communicable diseases: Secondary | ICD-10-CM | POA: Diagnosis not present

## 2019-05-04 DIAGNOSIS — F319 Bipolar disorder, unspecified: Secondary | ICD-10-CM

## 2019-05-05 NOTE — Telephone Encounter (Signed)
Requesting 90 day, does have apt on 05/13/2019

## 2019-05-13 ENCOUNTER — Encounter: Payer: Self-pay | Admitting: Psychiatry

## 2019-05-13 ENCOUNTER — Ambulatory Visit (INDEPENDENT_AMBULATORY_CARE_PROVIDER_SITE_OTHER): Payer: Self-pay | Admitting: Psychiatry

## 2019-05-13 VITALS — Wt 165.0 lb

## 2019-05-13 DIAGNOSIS — Z79899 Other long term (current) drug therapy: Secondary | ICD-10-CM

## 2019-05-13 DIAGNOSIS — F319 Bipolar disorder, unspecified: Secondary | ICD-10-CM

## 2019-05-13 DIAGNOSIS — F411 Generalized anxiety disorder: Secondary | ICD-10-CM

## 2019-05-13 DIAGNOSIS — F4310 Post-traumatic stress disorder, unspecified: Secondary | ICD-10-CM

## 2019-05-13 MED ORDER — FLUOXETINE HCL 20 MG PO CAPS: 20.0000 mg | ORAL_CAPSULE | Freq: Every day | ORAL | 5 refills | Status: DC

## 2019-05-13 MED ORDER — LITHIUM CARBONATE ER 300 MG PO TBCR: 900.0000 mg | EXTENDED_RELEASE_TABLET | Freq: Every day | ORAL | 1 refills | Status: DC

## 2019-05-13 MED ORDER — BUSPIRONE HCL 15 MG PO TABS: 22.5000 mg | ORAL_TABLET | Freq: Two times a day (BID) | ORAL | 5 refills | Status: DC

## 2019-05-13 NOTE — Progress Notes (Addendum)
[name] [id number] Dec 21, 1985 34 y.o.  Virtual Visit via Video Note  I connected with pt @ on 05/13/19 at  8:00 AM EST by a video enabled telemedicine application and verified that I am speaking with the correct person using two identifiers.   I discussed the limitations of evaluation and management by telemedicine and the availability of in person appointments. The patient expressed understanding and agreed to proceed.  I discussed the assessment and treatment plan with the patient. The patient was provided an opportunity to ask questions and all were answered. The patient agreed with the plan and demonstrated an understanding of the instructions.   The patient was advised to call back or seek an in-person evaluation if the symptoms worsen or if the condition fails to improve as anticipated.  I provided 20 minutes of non-face-to-face time during this encounter.  The patient was located at home.  The provider was located at [place].   Thayer Headings, PMHNP   Subjective:   Patient ID:  [name] is a 34 y.o. (DOB [birth year]) female.  Chief Complaint:  Chief Complaint  Patient presents with  . Follow-up    anxiety, h/o mood instability and sleep disturbance    HPI [name] presents for follow-up of Bipolar D/O and anxiety. She reports that her mood has "been pretty normal" and denies persistent sad mood. She reprots that she has had episodic anxiety that has been manageable. Occ worry. Denies any recent panic s/s. Denies intrusive memories.   She reports that she has difficulty falling and staying asleep at time. She reports that she is getting an adequate amount of sleep but sleep has been more interrupted. She reports that she has regular nightmares and it has been consistent with baseline. She reports that her energy and motivation have been ok. 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 She reports that her concentration is harder at some times  than others. Some occ distractibility. Denies any recent manic s/s. Denies SI.   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 Classes have been meeting online and this took some adjustment. 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 Going to school full-time and currently not working. Participating in [employer].  Past medication trials: Depakote-effective. Tremor Lithium-effective Lamictal-rash Abilify Seroquel-effective. Caused weight gain. Symbyax Latuda Saphris-adverse reaction Rexulti-ineffective Prozac BuSpar-effective for anxiety Vistaril  Review of Systems:  Review of Systems  Gastrointestinal: Negative.   Musculoskeletal: Negative for gait problem.  Neurological: Positive for headaches. Negative for tremors.  Psychiatric/Behavioral:       Please refer to HPI    Medications: I have reviewed the patient's current medications.  Current Outpatient Medications  Medication Sig Dispense Refill  . lithium carbonate (LITHOBID) 300 MG CR tablet Take 3 tablets (900 mg total) by mouth at bedtime. 90 tablet 1  . Norgestimate-Ethinyl Estradiol Triphasic (ORTHO TRI-CYCLEN LO) 0.18/0.215/0.25 MG-25 MCG tab Take 1 tablet by mouth daily.    . busPIRone (BUSPAR) 15 MG tablet Take 1.5 tablets (22.5 mg total) by mouth 2 (two) times daily. 90 tablet 5  . FLUoxetine (PROZAC) 20 MG capsule Take 1 capsule (20 mg total) by mouth daily. 30 capsule 5  . hydrOXYzine (ATARAX/VISTARIL) 25 MG tablet Take 1 tablet (25 mg total) by mouth every 6 (six) hours as needed for up to 30 days. 90 tablet 2   No current facility-administered medications for this visit.    Medication Side Effects: None  Allergies:  Allergies  Allergen Reactions  .  Lamictal [Lamotrigine]   . Sulfa Antibiotics     History reviewed. No pertinent past medical history.  Family History  Problem Relation Age of Onset  . Drug abuse Maternal Aunt   . Drug abuse Maternal Uncle   . Alcohol abuse Maternal  Grandfather   . Alcohol abuse Maternal Grandmother   . Drug abuse Cousin     Social History   Socioeconomic History  . Marital status: Single    Spouse name: Not on file  . Number of children: Not on file  . Years of education: Not on file  . Highest education level: Not on file  Occupational History  . Not on file  Tobacco Use  . Smoking status: Former Games developer  . Smokeless tobacco: Never Used  Substance and Sexual Activity  . Alcohol use: Not on file  . Drug use: Not on file  . Sexual activity: Not on file  Other Topics Concern  . Not on file  Social History Narrative  . Not on file   Social Determinants of Health   Financial Resource Strain:   . Difficulty of Paying Living Expenses: Not on file  Food Insecurity:   . Worried About Programme researcher, broadcasting/film/video in the Last Year: Not on file  . Ran Out of Food in the Last Year: Not on file  Transportation Needs:   . Lack of Transportation (Medical): Not on file  . Lack of Transportation (Non-Medical): Not on file  Physical Activity:   . Days of Exercise per Week: Not on file  . Minutes of Exercise per Session: Not on file  Stress:   . Feeling of Stress : Not on file  Social Connections:   . Frequency of Communication with Friends and Family: Not on file  . Frequency of Social Gatherings with Friends and Family: Not on file  . Attends Religious Services: Not on file  . Active Member of Clubs or Organizations: Not on file  . Attends Banker Meetings: Not on file  . Marital Status: Not on file  Intimate Partner Violence:   . Fear of Current or Ex-Partner: Not on file  . Emotionally Abused: Not on file  . Physically Abused: Not on file  . Sexually Abused: Not on file    Past Medical History, Surgical history, Social history, and Family history were reviewed and updated as appropriate.   Please see review of systems for further details on the patient's review from today.   Objective:   Physical Exam:  Wt 165  lb (74.8 kg)   Physical Exam Neurological:     Mental Status: She is alert and oriented to person, place, and time.     Cranial Nerves: No dysarthria.  Psychiatric:        Attention and Perception: Attention and perception normal.        Mood and Affect: Mood normal.        Speech: Speech normal.        Behavior: Behavior is cooperative.        Thought Content: Thought content normal. Thought content is not paranoid or delusional. Thought content does not include homicidal or suicidal ideation. Thought content does not include homicidal or suicidal plan.        Cognition and Memory: Cognition and memory normal.        Judgment: Judgment normal.     Comments: Insight intact     Lab Review:     Component Value Date/Time   NA 139  11/21/2010 0606   K 3.1 (L) 11/21/2010 0606   CL 106 11/21/2010 0606   CO2 27 11/21/2010 0606   GLUCOSE 94 11/21/2010 0606   BUN 12 11/21/2010 0606   CREATININE <0.47 (L) 11/21/2010 0606   CALCIUM 8.5 11/21/2010 0606   PROT 5.5 (L) 11/21/2010 0606   ALBUMIN 2.8 (L) 11/21/2010 0606   AST 17 11/21/2010 0606   ALT 9 11/21/2010 0606   ALKPHOS 54 11/21/2010 0606   BILITOT 0.3 11/21/2010 0606   GFRNONAA NOT CALCULATED 11/21/2010 0606   GFRAA NOT CALCULATED 11/21/2010 0606       Component Value Date/Time   WBC 6.0 11/21/2010 0606   RBC 3.51 (L) 11/21/2010 0606   HGB 11.3 (L) 11/21/2010 0606   HCT 33.1 (L) 11/21/2010 0606   PLT 137 (L) 11/21/2010 0606   MCV 94.3 11/21/2010 0606   MCH 32.2 11/21/2010 0606   MCHC 34.1 11/21/2010 0606   RDW 12.2 11/21/2010 0606   LYMPHSABS 2.2 11/18/2010 1246   MONOABS 0.6 11/18/2010 1246   EOSABS 0.1 11/18/2010 1246   BASOSABS 0.0 11/18/2010 1246    No results found for: POCLITH, LITHIUM   Lab Results  Component Value Date   VALPROATE 90.7 11/21/2010     .res Assessment: Plan:   Will continue current plan of care since target signs and symptoms are well controlled without any tolerability  issues. Discussed obtaining labs to check lithium level and to also assess for potential adverse effects.  Provided patient with information regarding lab location and that lab order has been sent electronically.  Discussed that she would be contacted with lab results. Patient to follow-up in 6 months or sooner if clinically indicated. Patient advised to contact office with any questions, adverse effects, or acute worsening in signs and symptoms.  [name] was seen today for follow-up.  Diagnoses and all orders for this visit:  Bipolar I disorder ([facility]) -     lithium carbonate (LITHOBID) 300 MG CR tablet; Take 3 tablets (900 mg total) by mouth at bedtime.  High risk medication use -     Lithium level -     Basic metabolic panel -     TSH  PTSD (post-traumatic stress disorder) -     busPIRone (BUSPAR) 15 MG tablet; Take 1.5 tablets (22.5 mg total) by mouth 2 (two) times daily. -     FLUoxetine (PROZAC) 20 MG capsule; Take 1 capsule (20 mg total) by mouth daily.  Generalized anxiety disorder -     busPIRone (BUSPAR) 15 MG tablet; Take 1.5 tablets (22.5 mg total) by mouth 2 (two) times daily. -     FLUoxetine (PROZAC) 20 MG capsule; Take 1 capsule (20 mg total) by mouth daily.     Please see After Visit Summary for patient specific instructions.  No future appointments.  Orders Placed This Encounter  Procedures  . Lithium level  . Basic metabolic panel  . TSH      -------------------------------

## 2019-10-17 ENCOUNTER — Other Ambulatory Visit: Payer: Self-pay | Admitting: Psychiatry

## 2019-10-17 DIAGNOSIS — F319 Bipolar disorder, unspecified: Secondary | ICD-10-CM

## 2019-11-22 ENCOUNTER — Other Ambulatory Visit: Payer: Self-pay | Admitting: Psychiatry

## 2019-11-22 DIAGNOSIS — F411 Generalized anxiety disorder: Secondary | ICD-10-CM

## 2019-11-22 DIAGNOSIS — F4310 Post-traumatic stress disorder, unspecified: Secondary | ICD-10-CM

## 2019-12-19 ENCOUNTER — Other Ambulatory Visit: Payer: Self-pay | Admitting: Psychiatry

## 2019-12-19 DIAGNOSIS — F4310 Post-traumatic stress disorder, unspecified: Secondary | ICD-10-CM

## 2019-12-19 DIAGNOSIS — F411 Generalized anxiety disorder: Secondary | ICD-10-CM

## 2019-12-20 ENCOUNTER — Other Ambulatory Visit: Payer: Self-pay | Admitting: Psychiatry

## 2019-12-20 DIAGNOSIS — F4310 Post-traumatic stress disorder, unspecified: Secondary | ICD-10-CM

## 2019-12-20 DIAGNOSIS — F411 Generalized anxiety disorder: Secondary | ICD-10-CM

## 2019-12-20 NOTE — Telephone Encounter (Signed)
Schedule apt

## 2019-12-20 NOTE — Telephone Encounter (Signed)
Last apt 04/2019, still nothing scheduled yet

## 2020-01-20 ENCOUNTER — Other Ambulatory Visit: Payer: Self-pay | Admitting: Psychiatry

## 2020-01-20 DIAGNOSIS — F319 Bipolar disorder, unspecified: Secondary | ICD-10-CM

## 2020-01-20 NOTE — Telephone Encounter (Signed)
Please review

## 2020-01-22 ENCOUNTER — Other Ambulatory Visit: Payer: Self-pay | Admitting: Psychiatry

## 2020-01-22 DIAGNOSIS — F4310 Post-traumatic stress disorder, unspecified: Secondary | ICD-10-CM

## 2020-01-22 DIAGNOSIS — F411 Generalized anxiety disorder: Secondary | ICD-10-CM

## 2020-01-23 NOTE — Telephone Encounter (Signed)
Please review

## 2020-02-20 ENCOUNTER — Other Ambulatory Visit: Payer: Self-pay | Admitting: Psychiatry

## 2020-02-20 DIAGNOSIS — F4310 Post-traumatic stress disorder, unspecified: Secondary | ICD-10-CM

## 2020-02-20 DIAGNOSIS — F411 Generalized anxiety disorder: Secondary | ICD-10-CM

## 2020-02-21 NOTE — Telephone Encounter (Signed)
Last apt was in Jan. 2021

## 2020-03-24 ENCOUNTER — Other Ambulatory Visit: Payer: Self-pay | Admitting: Psychiatry

## 2020-03-24 DIAGNOSIS — F411 Generalized anxiety disorder: Secondary | ICD-10-CM

## 2020-03-24 DIAGNOSIS — F4310 Post-traumatic stress disorder, unspecified: Secondary | ICD-10-CM

## 2020-03-26 NOTE — Telephone Encounter (Signed)
Last apt 04/2019 

## 2020-03-26 NOTE — Telephone Encounter (Signed)
Her next appointment is 05/18/20.

## 2020-04-21 ENCOUNTER — Other Ambulatory Visit: Payer: Self-pay | Admitting: Psychiatry

## 2020-04-21 DIAGNOSIS — F319 Bipolar disorder, unspecified: Secondary | ICD-10-CM

## 2020-04-23 ENCOUNTER — Other Ambulatory Visit: Payer: Self-pay | Admitting: Psychiatry

## 2020-04-23 DIAGNOSIS — F4310 Post-traumatic stress disorder, unspecified: Secondary | ICD-10-CM

## 2020-04-23 DIAGNOSIS — F411 Generalized anxiety disorder: Secondary | ICD-10-CM

## 2020-05-18 ENCOUNTER — Telehealth: Payer: Self-pay | Admitting: Psychiatry

## 2020-05-18 ENCOUNTER — Encounter: Payer: Self-pay | Admitting: Psychiatry

## 2020-05-18 ENCOUNTER — Telehealth (INDEPENDENT_AMBULATORY_CARE_PROVIDER_SITE_OTHER): Payer: 59 | Admitting: Psychiatry

## 2020-05-18 DIAGNOSIS — F4310 Post-traumatic stress disorder, unspecified: Secondary | ICD-10-CM

## 2020-05-18 DIAGNOSIS — F319 Bipolar disorder, unspecified: Secondary | ICD-10-CM

## 2020-05-18 DIAGNOSIS — Z79899 Other long term (current) drug therapy: Secondary | ICD-10-CM

## 2020-05-18 DIAGNOSIS — F411 Generalized anxiety disorder: Secondary | ICD-10-CM

## 2020-05-18 DIAGNOSIS — F99 Mental disorder, not otherwise specified: Secondary | ICD-10-CM

## 2020-05-18 DIAGNOSIS — F5105 Insomnia due to other mental disorder: Secondary | ICD-10-CM

## 2020-05-18 MED ORDER — FLUOXETINE HCL 20 MG PO CAPS: ORAL_CAPSULE | ORAL | 1 refills | Status: DC

## 2020-05-18 MED ORDER — BUSPIRONE HCL 15 MG PO TABS: ORAL_TABLET | ORAL | 1 refills | Status: DC

## 2020-05-18 MED ORDER — HYDROXYZINE HCL 25 MG PO TABS: 25.0000 mg | ORAL_TABLET | Freq: Four times a day (QID) | ORAL | 2 refills | Status: DC | PRN

## 2020-05-18 MED ORDER — LITHIUM CARBONATE ER 300 MG PO TBCR: 900.0000 mg | EXTENDED_RELEASE_TABLET | Freq: Every day | ORAL | 1 refills | Status: DC

## 2020-05-18 NOTE — Telephone Encounter (Signed)
Ms. [name], [name] are scheduled for a virtual visit with your provider today.    Just as we do with appointments in the office, we must obtain your consent to participate.  Your consent will be active for this visit and any virtual visit you may have with one of our providers in the next 365 days.    If you have a MyChart account, I can also send a copy of this consent to you electronically.  All virtual visits are billed to your insurance company just like a traditional visit in the office.  As this is a virtual visit, video technology does not allow for your provider to perform a traditional examination.  This may limit your provider's ability to fully assess your condition.  If your provider identifies any concerns that need to be evaluated in person or the need to arrange testing such as labs, EKG, etc, we will make arrangements to do so.    Although advances in technology are sophisticated, we cannot ensure that it will always work on either your end or our end.  If the connection with a video visit is poor, we may have to switch to a telephone visit.  With either a video or telephone visit, we are not always able to ensure that we have a secure connection.   I need to obtain your verbal consent now.   Are you willing to proceed with your visit today?   [name] has provided verbal consent on 05/18/2020 for a virtual visit (video or telephone).   Corie Chiquito, PMHNP 05/18/2020  1:21 PM

## 2020-05-18 NOTE — Progress Notes (Signed)
[name] [id number] 02-16-1986 35 y.o.  Virtual Visit via Video Note  I connected with pt @ on 05/18/20 at  1:15 PM EST by a video enabled telemedicine application and verified that I am speaking with the correct person using two identifiers.   I discussed the limitations of evaluation and management by telemedicine and the availability of in person appointments. The patient expressed understanding and agreed to proceed.  I discussed the assessment and treatment plan with the patient. The patient was provided an opportunity to ask questions and all were answered. The patient agreed with the plan and demonstrated an understanding of the instructions.   The patient was advised to call back or seek an in-person evaluation if the symptoms worsen or if the condition fails to improve as anticipated.  I provided 30 minutes of non-face-to-face time during this encounter.  The patient was located at home.  The provider was located at [facility].   Corie Chiquito, PMHNP   Subjective:   Patient ID:  [name] is a 35 y.o. (DOB 01/25/1986) female.  Chief Complaint:  Chief Complaint  Patient presents with  . Follow-up    H/o mood disturbance, anxiety, and insomnia    HPI [name] presents for follow-up of mood d/o, anxiety, and insomnia. 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 She started the new job in August and soon after she started, 2 other people left. 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  She reports, "overall my mood has been super stable." She reports that her mood has been evens since starting on Lithium. She reports that she has some anxiety and stressors. She reports that she is using Vistaril prn with some relief. She reports increased stress over the past week and had some trouble sleeping. She reports that she has taken Vistaril prn since some benefit. She  reports that she had been sleeping well until about a week ago. She reports some occasional nightmares. Appetite has been good. Energy and motivation fluctuate. Denies any manic s/s in over a year. Concentration is generally ok. Denies SI.   Past medication trials: Depakote-effective. Tremor Lithium-effective Lamictal-rash Abilify Seroquel-effective. Caused weight gain. Symbyax Latuda Saphris-adverse reaction Rexulti-ineffective Prozac BuSpar-effective for anxiety Vistaril  Review of Systems:  Review of Systems  Gastrointestinal: Negative.   Musculoskeletal: Negative for gait problem.  Neurological: Negative for tremors.  Psychiatric/Behavioral:       Please refer to HPI    Medications: I have reviewed the patient's current medications.  Current Outpatient Medications  Medication Sig Dispense Refill  . Norgestimate-Ethinyl Estradiol Triphasic 0.18/0.215/0.25 MG-25 MCG tab Take 1 tablet by mouth daily.    . busPIRone (BUSPAR) 15 MG tablet TAKE 1 AND 1/2 TABLETS BY MOUTH TWICE A DAY 270 tablet 1  . FLUoxetine (PROZAC) 20 MG capsule TAKE 1 CAPSULE BY MOUTH EVERY DAY 90 capsule 1  . hydrOXYzine (ATARAX/VISTARIL) 25 MG tablet Take 1 tablet (25 mg total) by mouth every 6 (six) hours as needed. 90 tablet 2  . lithium carbonate (LITHOBID) 300 MG CR tablet Take 3 tablets (900 mg total) by mouth at bedtime. 270 tablet 1  . SUMAtriptan (IMITREX) 100 MG tablet Take 100 mg by mouth as directed.     No current facility-administered medications for this visit.    Medication Side Effects: None  Allergies:  Allergies  Allergen Reactions  . Lamictal [Lamotrigine]   . Sulfa Antibiotics  History reviewed. No pertinent past medical history.  Family History  Problem Relation Age of Onset  . Drug abuse Maternal Aunt   . Drug abuse Maternal Uncle   . Alcohol abuse Maternal Grandfather   . Alcohol abuse Maternal Grandmother   . Drug abuse Cousin     Social History    Socioeconomic History  . Marital status: Single    Spouse name: Not on file  . Number of children: Not on file  . Years of education: Not on file  . Highest education level: Not on file  Occupational History  . Not on file  Tobacco Use  . Smoking status: Former Games developer  . Smokeless tobacco: Never Used  Substance and Sexual Activity  . Alcohol use: Not on file  . Drug use: Not on file  . Sexual activity: Not on file  Other Topics Concern  . Not on file  Social History Narrative  . Not on file   Social Determinants of Health   Financial Resource Strain: Not on file  Food Insecurity: Not on file  Transportation Needs: Not on file  Physical Activity: Not on file  Stress: Not on file  Social Connections: Not on file  Intimate Partner Violence: Not on file    Past Medical History, Surgical history, Social history, and Family history were reviewed and updated as appropriate.   Please see review of systems for further details on the patient's review from today.   Objective:   Physical Exam:  There were no vitals taken for this visit.  Physical Exam Neurological:     Mental Status: She is alert and oriented to person, place, and time.     Cranial Nerves: No dysarthria.  Psychiatric:        Attention and Perception: Attention and perception normal.        Mood and Affect: Mood normal.        Speech: Speech normal.        Behavior: Behavior is cooperative.        Thought Content: Thought content normal. Thought content is not paranoid or delusional. Thought content does not include homicidal or suicidal ideation. Thought content does not include homicidal or suicidal plan.        Cognition and Memory: Cognition and memory normal.        Judgment: Judgment normal.     Comments: Insight intact     Lab Review:     Component Value Date/Time   NA 139 11/21/2010 0606   K 3.1 (L) 11/21/2010 0606   CL 106 11/21/2010 0606   CO2 27 11/21/2010 0606   GLUCOSE 94 11/21/2010  0606   BUN 12 11/21/2010 0606   CREATININE <0.47 (L) 11/21/2010 0606   CALCIUM 8.5 11/21/2010 0606   PROT 5.5 (L) 11/21/2010 0606   ALBUMIN 2.8 (L) 11/21/2010 0606   AST 17 11/21/2010 0606   ALT 9 11/21/2010 0606   ALKPHOS 54 11/21/2010 0606   BILITOT 0.3 11/21/2010 0606   GFRNONAA NOT CALCULATED 11/21/2010 0606   GFRAA NOT CALCULATED 11/21/2010 0606       Component Value Date/Time   WBC 6.0 11/21/2010 0606   RBC 3.51 (L) 11/21/2010 0606   HGB 11.3 (L) 11/21/2010 0606   HCT 33.1 (L) 11/21/2010 0606   PLT 137 (L) 11/21/2010 0606   MCV 94.3 11/21/2010 0606   MCH 32.2 11/21/2010 0606   MCHC 34.1 11/21/2010 0606   RDW 12.2 11/21/2010 0606   LYMPHSABS 2.2 11/18/2010 1246  MONOABS 0.6 11/18/2010 1246   EOSABS 0.1 11/18/2010 1246   BASOSABS 0.0 11/18/2010 1246    No results found for: POCLITH, LITHIUM   Lab Results  Component Value Date   VALPROATE 90.7 11/21/2010     .res Assessment: Plan:   Will continue current plan of care since target signs and symptoms are well controlled without any tolerability issues. Discussed refilling Vistaril since pt reports that this has been helpful for episodic anxiety and insomnia. Advised pt to contact office if anxiety is not manageable with Vistaril prn.  Discussed obtaining labs to evaluate for possible adverse effects and to obtain Lithium level. Pt agrees to obtain labs. Will mail lab requisition to pt's home. Will order BMP, Lithium level, and TSH. Pt to follow-up in 6 months or sooner if clinically indicated.  Patient advised to contact office with any questions, adverse effects, or acute worsening in signs and symptoms.   Jameka was seen today for follow-up.  Diagnoses and all orders for this visit:  High risk medication use -     Lithium level -     Basic metabolic panel -     TSH  PTSD (post-traumatic stress disorder) -     busPIRone (BUSPAR) 15 MG tablet; TAKE 1 AND 1/2 TABLETS BY MOUTH TWICE A DAY -     FLUoxetine  (PROZAC) 20 MG capsule; TAKE 1 CAPSULE BY MOUTH EVERY DAY  Generalized anxiety disorder -     busPIRone (BUSPAR) 15 MG tablet; TAKE 1 AND 1/2 TABLETS BY MOUTH TWICE A DAY -     FLUoxetine (PROZAC) 20 MG capsule; TAKE 1 CAPSULE BY MOUTH EVERY DAY -     hydrOXYzine (ATARAX/VISTARIL) 25 MG tablet; Take 1 tablet (25 mg total) by mouth every 6 (six) hours as needed.  Bipolar I disorder (HCC) -     lithium carbonate (LITHOBID) 300 MG CR tablet; Take 3 tablets (900 mg total) by mouth at bedtime.  Insomnia due to other mental disorder -     hydrOXYzine (ATARAX/VISTARIL) 25 MG tablet; Take 1 tablet (25 mg total) by mouth every 6 (six) hours as needed.     Please see After Visit Summary for patient specific instructions.  No future appointments.  Orders Placed This Encounter  Procedures  . Lithium level  . Basic metabolic panel  . TSH      -------------------------------

## 2020-11-09 ENCOUNTER — Telehealth: Payer: Self-pay | Admitting: Psychiatry

## 2020-11-09 DIAGNOSIS — F319 Bipolar disorder, unspecified: Secondary | ICD-10-CM

## 2020-11-09 DIAGNOSIS — Z79899 Other long term (current) drug therapy: Secondary | ICD-10-CM

## 2020-11-09 MED ORDER — LITHIUM CARBONATE ER 300 MG PO TBCR: 900.0000 mg | EXTENDED_RELEASE_TABLET | Freq: Every day | ORAL | 0 refills | Status: DC

## 2020-11-09 NOTE — Telephone Encounter (Signed)
Please let her know labs were re-sent to [facility] and a 30-day supply of Lithium was sent to pharmacy.

## 2020-11-09 NOTE — Telephone Encounter (Signed)
Ok to send since she has not gotten a lab?And please send new lab order

## 2020-11-09 NOTE — Telephone Encounter (Signed)
Pt called and said that she needs a refill on [facility] lithium [facility] [address]. She also needs new [facility] to [facility]. She said she never went in January and now those labs have expired. Her next appt is 8/12

## 2020-11-09 NOTE — Telephone Encounter (Signed)
Lvm with info. 

## 2020-11-22 LAB — TSH: TSH: 3.72 mIU/L

## 2020-11-22 LAB — BASIC METABOLIC PANEL
BUN: 8 mg/dL (ref 7–25)
CO2: 25 mmol/L (ref 20–32)
Calcium: 9 mg/dL (ref 8.6–10.2)
Chloride: 105 mmol/L (ref 98–110)
Creat: 0.64 mg/dL (ref 0.50–0.97)
Glucose, Bld: 96 mg/dL (ref 65–99)
Potassium: 4.1 mmol/L (ref 3.5–5.3)
Sodium: 138 mmol/L (ref 135–146)

## 2020-11-22 LAB — LITHIUM LEVEL: Lithium Lvl: 0.8 mmol/L (ref 0.6–1.2)

## 2020-11-22 NOTE — Telephone Encounter (Signed)
LVM with info

## 2020-11-22 NOTE — Telephone Encounter (Signed)
Please let her know lab results were all within normal limits and Lithium level was therapeutic.

## 2020-11-30 ENCOUNTER — Telehealth (INDEPENDENT_AMBULATORY_CARE_PROVIDER_SITE_OTHER): Payer: 59 | Admitting: Psychiatry

## 2020-11-30 ENCOUNTER — Encounter: Payer: Self-pay | Admitting: Psychiatry

## 2020-11-30 DIAGNOSIS — F319 Bipolar disorder, unspecified: Secondary | ICD-10-CM | POA: Diagnosis not present

## 2020-11-30 DIAGNOSIS — F5105 Insomnia due to other mental disorder: Secondary | ICD-10-CM | POA: Diagnosis not present

## 2020-11-30 DIAGNOSIS — F4310 Post-traumatic stress disorder, unspecified: Secondary | ICD-10-CM

## 2020-11-30 DIAGNOSIS — F411 Generalized anxiety disorder: Secondary | ICD-10-CM | POA: Diagnosis not present

## 2020-11-30 DIAGNOSIS — F99 Mental disorder, not otherwise specified: Secondary | ICD-10-CM

## 2020-11-30 MED ORDER — FLUOXETINE HCL 20 MG PO CAPS: ORAL_CAPSULE | ORAL | 1 refills | Status: DC

## 2020-11-30 MED ORDER — HYDROXYZINE HCL 25 MG PO TABS: 25.0000 mg | ORAL_TABLET | Freq: Four times a day (QID) | ORAL | 2 refills | Status: DC | PRN

## 2020-11-30 MED ORDER — BUSPIRONE HCL 15 MG PO TABS: ORAL_TABLET | ORAL | 1 refills | Status: DC

## 2020-11-30 MED ORDER — LITHIUM CARBONATE ER 300 MG PO TBCR: 900.0000 mg | EXTENDED_RELEASE_TABLET | Freq: Every day | ORAL | 1 refills | Status: DC

## 2020-11-30 NOTE — Progress Notes (Signed)
[name] [id number] 1985-11-08 35 y.o.  Virtual Visit via Video Note  I connected with pt @ on 11/30/20 at  1:45 PM EDT by a video enabled telemedicine application and verified that I am speaking with the correct person using two identifiers.   I discussed the limitations of evaluation and management by telemedicine and the availability of in person appointments. The patient expressed understanding and agreed to proceed.  I discussed the assessment and treatment plan with the patient. The patient was provided an opportunity to ask questions and all were answered. The patient agreed with the plan and demonstrated an understanding of the instructions.   The patient was advised to call back or seek an in-person evaluation if the symptoms worsen or if the condition fails to improve as anticipated.  I provided 15 minutes of non-face-to-face time during this encounter.  The patient was located at home.  The provider was located at [facility].   Corie Chiquito, PMHNP   Subjective:   Patient ID:  [name] is a 35 y.o. (DOB 1985/12/07) female.  Chief Complaint:  Chief Complaint  Patient presents with   Follow-up    Bipolar D/O and anxiety    HPI [name] presents for follow-up of anxiety and mood disturbance. She reports that she currently has COVID. She reports that that she has otherwise been doing ok. She reports that her mood has been "pretty stable." Denies any severe depression. Denies any manic s/s. She reports occasional issues with anxiety. She notices some worry. Occ physical s/s with anxiety to include feeling shaky. She reports that during times of increased stress she will experience disrupted sleep and appetite. She reports that her sleep is mostly good. Occ "bizarre or frightening dreams." She reports that her energy seems to be low at baseline. She reports that her energy is lower later in the day. Motivation has been ok. Concentration has  been "mostly good." Denies SI.   Now working with [employer] in [place]. Started in late January.   She rarely takes Hydroxyzine, typically when anxiety is affecting sleep.  Past medication trials: Depakote-effective.  Tremor Lithium-effective Lamictal-rash Abilify Seroquel-effective.  Caused weight gain. Symbyax Latuda Saphris-adverse reaction Rexulti-ineffective Prozac BuSpar-effective for anxiety Vistaril  Review of Systems:  Review of Systems  HENT:  Positive for congestion.   Gastrointestinal: Negative.   Musculoskeletal:  Negative for gait problem.  Neurological:  Negative for tremors.  Psychiatric/Behavioral:         Please refer to HPI   Medications: I have reviewed the patient's current medications.  Current Outpatient Medications  Medication Sig Dispense Refill   Norgestimate-Ethinyl Estradiol Triphasic 0.18/0.215/0.25 MG-25 MCG tab Take 1 tablet by mouth daily.     NURTEC 75 MG TBDP Take by mouth. Takes every other day     SUMAtriptan (IMITREX) 100 MG tablet Take 100 mg by mouth as directed.     busPIRone (BUSPAR) 15 MG tablet TAKE 1 AND 1/2 TABLETS BY MOUTH TWICE A DAY 270 tablet 1   FLUoxetine (PROZAC) 20 MG capsule TAKE 1 CAPSULE BY MOUTH EVERY DAY 90 capsule 1   hydrOXYzine (ATARAX/VISTARIL) 25 MG tablet Take 1 tablet (25 mg total) by mouth every 6 (six) hours as needed. 90 tablet 2   lithium carbonate (LITHOBID) 300 MG CR tablet Take 3 tablets (900 mg total) by mouth at bedtime. 270 tablet 1   No current facility-administered medications for this visit.    Medication Side Effects: Other: Possible fatigue  Allergies:  Allergies  Allergen Reactions   Lamictal [Lamotrigine]    Sulfa Antibiotics     History reviewed. No pertinent past medical history.  Family History  Problem Relation Age of Onset   Drug abuse Maternal Aunt    Drug abuse Maternal Uncle    Alcohol abuse Maternal Grandfather    Alcohol abuse Maternal Grandmother    Drug abuse Cousin      Social History   Socioeconomic History   Marital status: Single    Spouse name: Not on file   Number of children: Not on file   Years of education: Not on file   Highest education level: Not on file  Occupational History   Not on file  Tobacco Use   Smoking status: Former   Smokeless tobacco: Never  Substance and Sexual Activity   Alcohol use: Not on file   Drug use: Not on file   Sexual activity: Not on file  Other Topics Concern   Not on file  Social History Narrative   Not on file   Social Determinants of Health   Financial Resource Strain: Not on file  Food Insecurity: Not on file  Transportation Needs: Not on file  Physical Activity: Not on file  Stress: Not on file  Social Connections: Not on file  Intimate Partner Violence: Not on file    Past Medical History, Surgical history, Social history, and Family history were reviewed and updated as appropriate.   Please see review of systems for further details on the patient's review from today.   Objective:   Physical Exam:  There were no vitals taken for this visit.  Physical Exam Constitutional:      Appearance: She is ill-appearing.  Neurological:     Mental Status: She is alert and oriented to person, place, and time.     Cranial Nerves: No dysarthria.  Psychiatric:        Attention and Perception: Attention and perception normal.        Mood and Affect: Mood normal.        Speech: Speech normal.        Behavior: Behavior is cooperative.        Thought Content: Thought content normal. Thought content is not paranoid or delusional. Thought content does not include homicidal or suicidal ideation. Thought content does not include homicidal or suicidal plan.        Cognition and Memory: Cognition and memory normal.        Judgment: Judgment normal.     Comments: Insight intact    Lab Review:     Component Value Date/Time   NA 138 11/21/2020 0758   K 4.1 11/21/2020 0758   CL 105 11/21/2020 0758    CO2 25 11/21/2020 0758   GLUCOSE 96 11/21/2020 0758   BUN 8 11/21/2020 0758   CREATININE 0.64 11/21/2020 0758   CALCIUM 9.0 11/21/2020 0758   PROT 5.5 (L) 11/21/2010 0606   ALBUMIN 2.8 (L) 11/21/2010 0606   AST 17 11/21/2010 0606   ALT 9 11/21/2010 0606   ALKPHOS 54 11/21/2010 0606   BILITOT 0.3 11/21/2010 0606   GFRNONAA NOT CALCULATED 11/21/2010 0606   GFRAA NOT CALCULATED 11/21/2010 0606       Component Value Date/Time   WBC 6.0 11/21/2010 0606   RBC 3.51 (L) 11/21/2010 0606   HGB 11.3 (L) 11/21/2010 0606   HCT 33.1 (L) 11/21/2010 0606   PLT 137 (L) 11/21/2010 0606   MCV 94.3 11/21/2010 0606  MCH 32.2 11/21/2010 0606   MCHC 34.1 11/21/2010 0606   RDW 12.2 11/21/2010 0606   LYMPHSABS 2.2 11/18/2010 1246   MONOABS 0.6 11/18/2010 1246   EOSABS 0.1 11/18/2010 1246   BASOSABS 0.0 11/18/2010 1246    Lithium Lvl  Date Value Ref Range Status  11/21/2020 0.8 0.6 - 1.2 mmol/L Final     Lab Results  Component Value Date   VALPROATE 90.7 11/21/2010     .res Assessment: Plan:   Reviewed lab results with pt and discussed that Lithium level was within therapeutic range. Discussed that creatinine and TSH were also within normal limits.  Will continue current plan of care since target signs and symptoms are well controlled without any tolerability issues. Pt to follow-up in 6 months or sooner if clinically indicated.  Patient advised to contact office with any questions, adverse effects, or acute worsening in signs and symptoms.  [name] was seen today for follow-up.  Diagnoses and all orders for this visit:  PTSD (post-traumatic stress disorder) -     busPIRone (BUSPAR) 15 MG tablet; TAKE 1 AND 1/2 TABLETS BY MOUTH TWICE A DAY -     FLUoxetine (PROZAC) 20 MG capsule; TAKE 1 CAPSULE BY MOUTH EVERY DAY  Generalized anxiety disorder -     busPIRone (BUSPAR) 15 MG tablet; TAKE 1 AND 1/2 TABLETS BY MOUTH TWICE A DAY -     FLUoxetine (PROZAC) 20 MG capsule; TAKE 1 CAPSULE BY  MOUTH EVERY DAY -     hydrOXYzine (ATARAX/VISTARIL) 25 MG tablet; Take 1 tablet (25 mg total) by mouth every 6 (six) hours as needed.  Bipolar I disorder (HCC) -     lithium carbonate (LITHOBID) 300 MG CR tablet; Take 3 tablets (900 mg total) by mouth at bedtime.  Insomnia due to other mental disorder -     hydrOXYzine (ATARAX/VISTARIL) 25 MG tablet; Take 1 tablet (25 mg total) by mouth every 6 (six) hours as needed.    Please see After Visit Summary for patient specific instructions.  No future appointments.   No orders of the defined types were placed in this encounter.     -------------------------------

## 2021-04-01 ENCOUNTER — Ambulatory Visit: Payer: 59 | Admitting: Psychiatry

## 2021-06-02 ENCOUNTER — Other Ambulatory Visit: Payer: Self-pay | Admitting: Psychiatry

## 2021-06-02 DIAGNOSIS — F4310 Post-traumatic stress disorder, unspecified: Secondary | ICD-10-CM

## 2021-06-02 DIAGNOSIS — F411 Generalized anxiety disorder: Secondary | ICD-10-CM

## 2021-06-05 NOTE — Telephone Encounter (Signed)
[name] called to make appt since she missed her last one.  She is scheduled for 07/02/21, in person.  Please refill her prescriptions as requested from the pharmacy.

## 2021-07-02 ENCOUNTER — Encounter: Payer: Self-pay | Admitting: Psychiatry

## 2021-07-02 ENCOUNTER — Other Ambulatory Visit: Payer: Self-pay

## 2021-07-02 ENCOUNTER — Other Ambulatory Visit: Payer: Self-pay | Admitting: Psychiatry

## 2021-07-02 ENCOUNTER — Ambulatory Visit (INDEPENDENT_AMBULATORY_CARE_PROVIDER_SITE_OTHER): Payer: BC Managed Care – PPO | Admitting: Psychiatry

## 2021-07-02 DIAGNOSIS — F411 Generalized anxiety disorder: Secondary | ICD-10-CM | POA: Diagnosis not present

## 2021-07-02 DIAGNOSIS — F4310 Post-traumatic stress disorder, unspecified: Secondary | ICD-10-CM

## 2021-07-02 DIAGNOSIS — F319 Bipolar disorder, unspecified: Secondary | ICD-10-CM

## 2021-07-02 MED ORDER — CARIPRAZINE HCL 1.5 MG PO CAPS: 1.5000 mg | ORAL_CAPSULE | Freq: Every day | ORAL | 1 refills | Status: DC

## 2021-07-02 MED ORDER — LITHIUM CARBONATE ER 300 MG PO TBCR: 900.0000 mg | EXTENDED_RELEASE_TABLET | Freq: Every day | ORAL | 1 refills | Status: DC

## 2021-07-02 NOTE — Progress Notes (Signed)
[name] ?[id number] ?1986/04/07 ?36 y.o. ? ?Subjective:  ? ?Patient ID:  [name] is a 36 y.o. (DOB 05/04/1985) female. ? ?Chief Complaint:  ?Chief Complaint  ?Patient presents with  ? Depression  ? ? ?HPI ?[name] presents to the office today for follow-up of anxiety and mood disturbance. She reports that her anxiety has been "really up and down." Describes "general angst and worries." Denies rumination. She reports worry that she will not be able to maintain her position. She reports occasional panic s/s in the morning. Has been having bad dreams. Denies flashbacks. Some hyper-vigilance. Denies intrusive memories.   ? ?Denies manic s/s. She reports that she has had some depression for about the last year. "It's always in the background." She reports that winter tends to be the most difficult season for her. She reports that she has had low motivation for some things, such as documentation and hygiene. She reports that her motivation is good for working with clients. Low energy. She reports that she has not been eating as well. She reports that she feels like no matter how much sleep she gets, she never feels fully rested. She reports sleeping excessively on the weekends. She reports diminished interest in some things. She reports difficulty with concentration. She reports some increased passive suicidal ideation. Denies SI.  ? 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 She reports that this has been stressful at times. They coordinate with different programs. Her scheduled hours are Monday-Friday, 8-5 pm. She works out in [employer] and can work some from home.  ? ?She has been in a relationship for about 5 years. She enjoys her pets and house plants. She enjoys meditation and gentle stretches.  ? ?Stopped OCP about 3 months ago. Denies any mood changes with stopping OCP.  ? ?Has been seeing a therapist virtually.  ? ?Past medication  trials: ?Depakote-effective.  Tremor. ?Lithium-effective ?Lamictal-rash ?Abilify-drowsiness ?Seroquel-effective.  Caused weight gain. ?Symbyax ?Latuda ?Saphris-adverse reaction. Cognitive side effects. ?Rexulti-ineffective ?Prozac ?BuSpar-effective for anxiety ?Vistaril-sedating ?  ? ?Review of Systems:  ?Review of Systems  ?Gastrointestinal: Negative.   ?Musculoskeletal:  Negative for gait problem.  ?Neurological:  Positive for tremors.  ?     Menstrual related migraines- long-standing  ?Psychiatric/Behavioral:    ?     Please refer to HPI  ? ?Medications: I have reviewed the patient's current medications. ? ?Current Outpatient Medications  ?Medication Sig Dispense Refill  ? busPIRone (BUSPAR) 15 MG tablet TAKE 1 AND 1/2 TABLETS BY MOUTH TWICE A DAY 270 tablet 1  ? cariprazine (VRAYLAR) 1.5 MG capsule Take 1 capsule (1.5 mg total) by mouth daily. 30 capsule 1  ? FLUoxetine (PROZAC) 20 MG capsule TAKE 1 CAPSULE BY MOUTH EVERY DAY 90 capsule 1  ? NURTEC 75 MG TBDP Take by mouth. Takes every other day    ? SUMAtriptan (IMITREX) 100 MG tablet Take 100 mg by mouth as directed.    ? hydrOXYzine (ATARAX/VISTARIL) 25 MG tablet Take 1 tablet (25 mg total) by mouth every 6 (six) hours as needed. 90 tablet 2  ? lithium carbonate (LITHOBID) 300 MG CR tablet Take 3 tablets (900 mg total) by mouth at bedtime. 270 tablet 1  ? ?No current facility-administered medications for this visit.  ? ? ?Medication Side Effects: None ? ?Allergies:  ?Allergies  ?Allergen Reactions  ? Lamictal [Lamotrigine]   ? Sulfa Antibiotics   ? ? ?  History reviewed. No pertinent past medical history. ? ?Past Medical History, Surgical history, Social history, and Family history were reviewed and updated as appropriate.  ? ?Please see review of systems for further details on the patient's review from today.  ? ?Objective:  ? ?Physical Exam:  ?There were no vitals taken for this visit. ? ?Physical Exam ?Constitutional:   ?   General: She is not in acute  distress. ?Musculoskeletal:     ?   General: No deformity.  ?Neurological:  ?   Mental Status: She is alert and oriented to person, place, and time.  ?   Coordination: Coordination normal.  ?Psychiatric:     ?   Attention and Perception: Attention and perception normal. She does not perceive auditory or visual hallucinations.     ?   Mood and Affect: Mood is depressed. Mood is not anxious. Affect is not labile, blunt, angry or inappropriate.     ?   Speech: Speech normal.     ?   Behavior: Behavior normal.     ?   Thought Content: Thought content normal. Thought content is not paranoid or delusional. Thought content does not include homicidal or suicidal ideation. Thought content does not include homicidal or suicidal plan.     ?   Cognition and Memory: Cognition and memory normal.     ?   Judgment: Judgment normal.  ?   Comments: Insight intact  ? ? ?Lab Review:  ?   ?Component Value Date/Time  ? NA 138 11/21/2020 0758  ? K 4.1 11/21/2020 0758  ? CL 105 11/21/2020 0758  ? CO2 25 11/21/2020 0758  ? GLUCOSE 96 11/21/2020 0758  ? BUN 8 11/21/2020 0758  ? CREATININE 0.64 11/21/2020 0758  ? CALCIUM 9.0 11/21/2020 0758  ? PROT 5.5 (L) 11/21/2010 0606  ? ALBUMIN 2.8 (L) 11/21/2010 0606  ? AST 17 11/21/2010 0606  ? ALT 9 11/21/2010 0606  ? ALKPHOS 54 11/21/2010 0606  ? BILITOT 0.3 11/21/2010 0606  ? GFRNONAA NOT CALCULATED 11/21/2010 0606  ? GFRAA NOT CALCULATED 11/21/2010 0606  ? ? ?   ?Component Value Date/Time  ? WBC 6.0 11/21/2010 0606  ? RBC 3.51 (L) 11/21/2010 0606  ? HGB 11.3 (L) 11/21/2010 0606  ? HCT 33.1 (L) 11/21/2010 0606  ? PLT 137 (L) 11/21/2010 0606  ? MCV 94.3 11/21/2010 0606  ? MCH 32.2 11/21/2010 0606  ? MCHC 34.1 11/21/2010 0606  ? RDW 12.2 11/21/2010 0606  ? LYMPHSABS 2.2 11/18/2010 1246  ? MONOABS 0.6 11/18/2010 1246  ? EOSABS 0.1 11/18/2010 1246  ? BASOSABS 0.0 11/18/2010 1246  ? ? ?Lithium Lvl  ?Date Value Ref Range Status  ?11/21/2020 0.8 0.6 - 1.2 mmol/L Final  ?  ? ?Lab Results  ?Component Value  Date  ? VALPROATE 90.7 11/21/2010  ?  ? ?.res ?Assessment: Plan:   ? ?Pt seen for 30 minutes and time spent discussing possible treatment options for depression. Discussed potential benefits, risks, and side effects of Vraylar. Discussed that it pharmacologically similar to Abilify with typically less likelihood of causing sedation since Abilify was effective in the past for her but caused excessive somnolence. Reviewed potential metabolic side effects associated with atypical antipsychotics, as well as potential risk for movement side effects. Advised pt to contact office if movement side effects occur.  ?Will start Vraylar 1.5 mg po qd for bipolar depression.  ?Continue Prozac 20 mg po qd for anxiety and depression.  ?Continue Buspar 15 mg po  BID for anxiety.  ?Continue Hydroxyzine 25 mg po q 6 hours prn anxiety.  ?Pt to follow-up in 4-6 weeks or sooner if clinically indicated.  ?Patient advised to contact office with any questions, adverse effects, or acute worsening in signs and symptoms. ? ? ?Marylouise StacksShandale was seen today for depression. ? ?Diagnoses and all orders for this visit: ? ?Bipolar I disorder (HCC) ?-     cariprazine (VRAYLAR) 1.5 MG capsule; Take 1 capsule (1.5 mg total) by mouth daily. ?-     lithium carbonate (LITHOBID) 300 MG CR tablet; Take 3 tablets (900 mg total) by mouth at bedtime. ? ?PTSD (post-traumatic stress disorder) ? ?Generalized anxiety disorder ? ?  ? ?Please see After Visit Summary for patient specific instructions. ? ?Future Appointments  ?Date Time Provider Department Center  ?08/13/2021  2:00 PM Corie Chiquitoarter, Emmah Bratcher, PMHNP [facility] None  ? ? ? ?No orders of the defined types were placed in this encounter. ? ? ?------------------------------- ?

## 2021-07-18 ENCOUNTER — Other Ambulatory Visit: Payer: Self-pay | Admitting: Psychiatry

## 2021-07-18 DIAGNOSIS — F319 Bipolar disorder, unspecified: Secondary | ICD-10-CM

## 2021-07-18 NOTE — Telephone Encounter (Signed)
Murriel called because the pharmacy told her the [name] needs a PA.  She is out of samples and requested to come get more to tie her over until the PA is done.  I have pulled 1 box of the Vraylar 1.5 mg for her to pick up. ?

## 2021-07-19 NOTE — Telephone Encounter (Signed)
Needs PA 

## 2021-07-21 NOTE — Telephone Encounter (Signed)
Will have to contact back of her insurance card. Not found with cover my meds ?

## 2021-07-24 NOTE — Telephone Encounter (Signed)
Prior authorization submitted through [email or web address] EOC# [id number] ?ID# [id number] ?For Vraylar 1.5 mg urgent request, records submitted  ? ?[employer] ?

## 2021-07-31 NOTE — Telephone Encounter (Signed)
Pulled samples for patient.  ?

## 2021-07-31 NOTE — Telephone Encounter (Signed)
Pt LVM at 2:53p.  She only has enough Well[employer] ge[employer] for today and tomorrow.  She wants to know the status of the Prior Auth.  She also needs samples of the med. ? ?Next appt 4/25 ?

## 2021-07-31 NOTE — Telephone Encounter (Signed)
Please F/U. I am unable to check Rxprompt. I will pull a week of samples for patient to cover.  ?

## 2021-07-31 NOTE — Telephone Encounter (Signed)
Her PA was approved on 07/24/2021, not sure if she hasn't checked with pharmacy but it should run. ?

## 2021-08-04 ENCOUNTER — Telehealth: Payer: Self-pay

## 2021-08-04 NOTE — Telephone Encounter (Signed)
Prior Authorization submitted and approved for VRAYLAR 1.5 MG CAPSULES effective 07/24/2021-07/24/2022 with RX Benefits ID# [id number] ?

## 2021-08-13 ENCOUNTER — Encounter: Payer: Self-pay | Admitting: Psychiatry

## 2021-08-13 ENCOUNTER — Ambulatory Visit (INDEPENDENT_AMBULATORY_CARE_PROVIDER_SITE_OTHER): Payer: BC Managed Care – PPO | Admitting: Psychiatry

## 2021-08-13 DIAGNOSIS — F411 Generalized anxiety disorder: Secondary | ICD-10-CM | POA: Diagnosis not present

## 2021-08-13 DIAGNOSIS — F319 Bipolar disorder, unspecified: Secondary | ICD-10-CM

## 2021-08-13 DIAGNOSIS — F4310 Post-traumatic stress disorder, unspecified: Secondary | ICD-10-CM | POA: Diagnosis not present

## 2021-08-13 MED ORDER — CARIPRAZINE HCL 1.5 MG PO CAPS: ORAL_CAPSULE | ORAL | 1 refills | Status: DC
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
## 2021-10-08 ENCOUNTER — Encounter: Payer: Self-pay | Admitting: Psychiatry

## 2021-10-08 ENCOUNTER — Ambulatory Visit: Payer: BC Managed Care – PPO | Admitting: Psychiatry

## 2021-10-08 DIAGNOSIS — F319 Bipolar disorder, unspecified: Secondary | ICD-10-CM | POA: Diagnosis not present

## 2021-10-08 DIAGNOSIS — F411 Generalized anxiety disorder: Secondary | ICD-10-CM

## 2021-10-08 DIAGNOSIS — F4310 Post-traumatic stress disorder, unspecified: Secondary | ICD-10-CM | POA: Diagnosis not present

## 2021-10-08 MED ORDER — BUSPIRONE HCL 15 MG PO TABS: ORAL_TABLET | ORAL | 1 refills | Status: DC

## 2021-10-08 MED ORDER — FLUOXETINE HCL 20 MG PO CAPS: 20.0000 mg | ORAL_CAPSULE | Freq: Every day | ORAL | 1 refills | Status: DC

## 2021-10-08 MED ORDER — CARIPRAZINE HCL 1.5 MG PO CAPS: 1.5000 mg | ORAL_CAPSULE | Freq: Every day | ORAL | 0 refills | Status: DC

## 2021-10-08 MED ORDER — LITHIUM CARBONATE ER 300 MG PO TBCR: 900.0000 mg | EXTENDED_RELEASE_TABLET | Freq: Every day | ORAL | 0 refills | Status: DC

## 2021-10-08 NOTE — Progress Notes (Signed)
[name] [id number] 05-Dec-1985 36 y.o.  Subjective:   Patient ID:  [name] is a 36 y.o. (DOB 08/09/1985) female.  Chief Complaint: No chief complaint on file.   HPI KENZLEIGH SEDAM presents to the office today for follow-up of mood disturbance, anxiety, and insomnia. Akathisia resolved after reducing Vraylar to every other day after a week and sleep improved. Mood was better controlled on Vraylar 1.5 mg daily. She reports that [name] has been helpful and is no longer having SI. She reports some depression and attributes this to job stress. Difficulty completing documentation on time. Sometimes difficult to find motivation and concentration to keep up with documentation and that it is also related to job volume.  Feels more productive since starting Vraylar. Showering less again. Energy and motivation have been slightly lower. Anxiety has been manageable. Denies panic symptoms. Buspar seems to be helpful for her anxiety. Appetite has been "normal." Interest and enjoyment in things have improved compared to before taking Vraylar and not as much as when she was taking Vraylar 1.5 mg daily. Has been reconnecting with some friends and socializing more. She reports difficulty with concentration at baseline. Denies SI.   Sleeping well. Sometimes sleeping excessively and feeling sleepy throughout the day.   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   Past medication trials: Depakote-effective.  Tremor. Lithium-effective Lamictal-rash Abilify-drowsiness Seroquel-effective.  Caused weight gain. Symbyax Latuda Saphris-adverse reaction. Cognitive side effects. Rexulti-ineffective Prozac BuSpar-effective for anxiety Vistaril-sedating  AIMS    Flowsheet Row Office Visit from 08/13/2021 in [facility]  AIMS Total Score 0        Review of Systems:  Review of Systems  Musculoskeletal:  Negative for gait problem.  Neurological:  Negative for tremors.   Psychiatric/Behavioral:         Please refer to HPI    Medications: I have reviewed the patient's current medications.  Current Outpatient Medications  Medication Sig Dispense Refill  . busPIRone (BUSPAR) 15 MG tablet TAKE 1 AND 1/2 TABLETS BY MOUTH TWICE A DAY 270 tablet 1  . cariprazine (VRAYLAR) 1.5 MG capsule Take 1 capsule every 1-2 days 30 capsule 1  . FLUoxetine (PROZAC) 20 MG capsule TAKE 1 CAPSULE BY MOUTH EVERY DAY 90 capsule 1  . hydrOXYzine (ATARAX/VISTARIL) 25 MG tablet Take 1 tablet (25 mg total) by mouth every 6 (six) hours as needed. 90 tablet 2  . lithium carbonate (LITHOBID) 300 MG CR tablet Take 3 tablets (900 mg total) by mouth at bedtime. 270 tablet 1  . NURTEC 75 MG TBDP Take by mouth. Takes every other day    . SUMAtriptan (IMITREX) 100 MG tablet Take 100 mg by mouth as directed.     No current facility-administered medications for this visit.    Medication Side Effects: None  Allergies:  Allergies  Allergen Reactions  . Lamictal [Lamotrigine]   . Sulfa Antibiotics     No past medical history on file.  Past Medical History, Surgical history, Social history, and Family history were reviewed and updated as appropriate.   Please see review of systems for further details on the patient's review from today.   Objective:   Physical Exam:  There were no vitals taken for this visit.  Physical Exam  Lab Review:     Component Value Date/Time   NA 138 11/21/2020 0758   K 4.1 11/21/2020 0758   CL 105 11/21/2020 0758   CO2 25 11/21/2020 0758   GLUCOSE 96 11/21/2020  0758   BUN 8 11/21/2020 0758   CREATININE 0.64 11/21/2020 0758   CALCIUM 9.0 11/21/2020 0758   PROT 5.5 (L) 11/21/2010 0606   ALBUMIN 2.8 (L) 11/21/2010 0606   AST 17 11/21/2010 0606   ALT 9 11/21/2010 0606   ALKPHOS 54 11/21/2010 0606   BILITOT 0.3 11/21/2010 0606   GFRNONAA NOT CALCULATED 11/21/2010 0606   GFRAA NOT CALCULATED 11/21/2010 0606       Component Value Date/Time    WBC 6.0 11/21/2010 0606   RBC 3.51 (L) 11/21/2010 0606   HGB 11.3 (L) 11/21/2010 0606   HCT 33.1 (L) 11/21/2010 0606   PLT 137 (L) 11/21/2010 0606   MCV 94.3 11/21/2010 0606   MCH 32.2 11/21/2010 0606   MCHC 34.1 11/21/2010 0606   RDW 12.2 11/21/2010 0606   LYMPHSABS 2.2 11/18/2010 1246   MONOABS 0.6 11/18/2010 1246   EOSABS 0.1 11/18/2010 1246   BASOSABS 0.0 11/18/2010 1246    Lithium Lvl  Date Value Ref Range Status  11/21/2020 0.8 0.6 - 1.2 mmol/L Final     Lab Results  Component Value Date   VALPROATE 90.7 11/21/2010     .res Assessment: Plan:   Attempt 1.5 mg daily There are no diagnoses linked to this encounter.   Please see After Visit Summary for patient specific instructions.  No future appointments.  No orders of the defined types were placed in this encounter.   -------------------------------

## 2021-11-19 ENCOUNTER — Ambulatory Visit: Payer: BC Managed Care – PPO | Admitting: Psychiatry

## 2021-11-21 ENCOUNTER — Other Ambulatory Visit: Payer: Self-pay | Admitting: Psychiatry

## 2021-11-21 ENCOUNTER — Encounter: Payer: Self-pay | Admitting: Psychiatry

## 2021-11-21 ENCOUNTER — Ambulatory Visit: Payer: BC Managed Care – PPO | Admitting: Psychiatry

## 2021-11-21 DIAGNOSIS — F411 Generalized anxiety disorder: Secondary | ICD-10-CM | POA: Diagnosis not present

## 2021-11-21 DIAGNOSIS — F4310 Post-traumatic stress disorder, unspecified: Secondary | ICD-10-CM

## 2021-11-21 DIAGNOSIS — Z79899 Other long term (current) drug therapy: Secondary | ICD-10-CM

## 2021-11-21 DIAGNOSIS — F319 Bipolar disorder, unspecified: Secondary | ICD-10-CM

## 2021-11-21 DIAGNOSIS — F5105 Insomnia due to other mental disorder: Secondary | ICD-10-CM

## 2021-11-21 DIAGNOSIS — F99 Mental disorder, not otherwise specified: Secondary | ICD-10-CM

## 2021-11-21 MED ORDER — CARIPRAZINE HCL 1.5 MG PO CAPS: 1.5000 mg | ORAL_CAPSULE | Freq: Every day | ORAL | 1 refills | Status: DC

## 2021-11-21 MED ORDER — FLUOXETINE HCL 20 MG PO CAPS: 20.0000 mg | ORAL_CAPSULE | Freq: Every day | ORAL | 1 refills | Status: DC

## 2021-11-21 MED ORDER — BUSPIRONE HCL 15 MG PO TABS: ORAL_TABLET | ORAL | 1 refills | Status: DC

## 2021-11-21 MED ORDER — LITHIUM CARBONATE ER 300 MG PO TBCR: 900.0000 mg | EXTENDED_RELEASE_TABLET | Freq: Every day | ORAL | 1 refills | Status: DC

## 2021-11-21 NOTE — Progress Notes (Signed)
[name] [id number] January 14, 1986 36 y.o.  Subjective:   Patient ID:  [name] is a 36 y.o. (DOB Apr 03, 1986) female.  Chief Complaint:  Chief Complaint  Patient presents with   Follow-up    Depression, anxiety, and insomnia    HPI Rilla D Grandinetti presents to the office today for follow-up of depression, anxiety, and insomnia.  She reports that she has not been having as much restlessness. She is continuing to take 1.5 mg every day and feels akathisia is gradually improving over time.   She notices some mild depression and that this may be related to burn out at work. Notices some mild irritability- "not too bad. Just internal." Denies manic s/s. Energy and motivation have improved some "but better than it was." Anxiety has been consistent with baseline and has been manageable. Reports hypervigilance at baseline. Occ exaggerated startle response. Denies panic attacks. Sleeping ok with some early morning awakenings. Waking up sometimes in a panic and occ remembering nightmares. Having some stress dreams. Falling asleep easily. Sleeping 6-8 hours a night. Appetite has been good. Some difficulty with concentration. She reports that she has been better about staying caught up at work. Denies any suicidal ideation.  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   Continues to see therapist.   Past medication trials: Depakote-effective.  Tremor. Lithium-effective Lamictal-rash Abilify-drowsiness Seroquel-effective.  Caused weight gain. Symbyax Latuda Saphris-adverse reaction. Cognitive side effects. Rexulti-ineffective Prozac BuSpar-effective for anxiety Vistaril-sedating     AIMS    Flowsheet Row Office Visit from 10/08/2021 in [facility] Office Visit from 08/13/2021 in [facility]  AIMS Total Score 0 0        Review of Systems:  Review of Systems   Musculoskeletal:  Negative for gait problem.  Neurological:  Negative for tremors.       Migraines  Psychiatric/Behavioral:         Please refer to HPI    Medications: I have reviewed the patient's current medications.  Current Outpatient Medications  Medication Sig Dispense Refill   NURTEC 75 MG TBDP Take by mouth. Takes every other day     SUMAtriptan (IMITREX) 100 MG tablet Take 100 mg by mouth as directed.     TRI-LO-ESTARYLLA 0.18/0.215/0.25 MG-25 MCG tab Take 1 tablet by mouth daily.     busPIRone (BUSPAR) 15 MG tablet TAKE 1 AND 1/2 TABLETS BY MOUTH TWICE A DAY 270 tablet 1   cariprazine (VRAYLAR) 1.5 MG capsule Take 1 capsule (1.5 mg total) by mouth daily. 90 capsule 1   FLUoxetine (PROZAC) 20 MG capsule Take 1 capsule (20 mg total) by mouth daily. TAKE 1 CAPSULE BY MOUTH EVERY DAY 90 capsule 1   hydrOXYzine (ATARAX/VISTARIL) 25 MG tablet Take 1 tablet (25 mg total) by mouth every 6 (six) hours as needed. 90 tablet 2   lithium carbonate (LITHOBID) 300 MG CR tablet Take 3 tablets (900 mg total) by mouth at bedtime. 270 tablet 1   No current facility-administered medications for this visit.    Medication Side Effects: Other: Mild restlessness  Allergies:  Allergies  Allergen Reactions   Lamictal [Lamotrigine]    Sulfa Antibiotics     History reviewed. No pertinent past medical history.  Past Medical History, Surgical history, Social history, and Family history were reviewed and updated as appropriate.   Please see review of systems for further details on the patient's review from today.   Objective:  Physical Exam:  There were no vitals taken for this visit.  Physical Exam Constitutional:      General: She is not in acute distress. Musculoskeletal:        General: No deformity.  Neurological:     Mental Status: She is alert and oriented to person, place, and time.     Coordination: Coordination normal.  Psychiatric:        Attention and Perception:  Attention and perception normal. She does not perceive auditory or visual hallucinations.        Mood and Affect: Affect is not labile, blunt, angry or inappropriate.        Speech: Speech normal.        Behavior: Behavior normal.        Thought Content: Thought content normal. Thought content is not paranoid or delusional. Thought content does not include homicidal or suicidal ideation. Thought content does not include homicidal or suicidal plan.        Cognition and Memory: Cognition and memory normal.        Judgment: Judgment normal.     Comments: Insight intact Mood presents as mildly depressed in response to possible burnout at work.  Mood is less depressed compared to last visit.     Lab Review:     Component Value Date/Time   NA 138 11/21/2020 0758   K 4.1 11/21/2020 0758   CL 105 11/21/2020 0758   CO2 25 11/21/2020 0758   GLUCOSE 96 11/21/2020 0758   BUN 8 11/21/2020 0758   CREATININE 0.64 11/21/2020 0758   CALCIUM 9.0 11/21/2020 0758   PROT 5.5 (L) 11/21/2010 0606   ALBUMIN 2.8 (L) 11/21/2010 0606   AST 17 11/21/2010 0606   ALT 9 11/21/2010 0606   ALKPHOS 54 11/21/2010 0606   BILITOT 0.3 11/21/2010 0606   GFRNONAA NOT CALCULATED 11/21/2010 0606   GFRAA NOT CALCULATED 11/21/2010 0606       Component Value Date/Time   WBC 6.0 11/21/2010 0606   RBC 3.51 (L) 11/21/2010 0606   HGB 11.3 (L) 11/21/2010 0606   HCT 33.1 (L) 11/21/2010 0606   PLT 137 (L) 11/21/2010 0606   MCV 94.3 11/21/2010 0606   MCH 32.2 11/21/2010 0606   MCHC 34.1 11/21/2010 0606   RDW 12.2 11/21/2010 0606   LYMPHSABS 2.2 11/18/2010 1246   MONOABS 0.6 11/18/2010 1246   EOSABS 0.1 11/18/2010 1246   BASOSABS 0.0 11/18/2010 1246    Lithium Lvl  Date Value Ref Range Status  11/21/2020 0.8 0.6 - 1.2 mmol/L Final     Lab Results  Component Value Date   VALPROATE 90.7 11/21/2010     .res Assessment: Plan:    Patient seen for 25 minutes and time spent discussing response to resuming Vraylar  1.5 mg daily.  She reports that she is no longer experiencing significant akathisia with this dose like she had previously.  She reports that restlessness is now minimal and is tolerable.  She reports that benefits Vraylar 1.5 mg daily are outweighing side effects and would like to continue current dose.  Will continue Vraylar 1.5 mg daily for mood symptoms. Continue lithium 900 mg at bedtime for mood stabilization.  Recommended obtaining labs to monitor for possible adverse effects.  Will order a lithium level, basic metabolic panel, and TSH. Continue BuSpar 15 mg 1.5 tabs twice daily for anxiety. Continue Prozac 20 mg daily for anxiety and depression. Continue hydroxyzine 25 mg as needed for anxiety or insomnia. Pt to  follow-up in 6 months or sooner if clinically indicated.  Patient advised to contact office with any questions, adverse effects, or acute worsening in signs and symptoms.   Jeronica was seen today for follow-up.  Diagnoses and all orders for this visit:  Bipolar I disorder (HCC) -     cariprazine (VRAYLAR) 1.5 MG capsule; Take 1 capsule (1.5 mg total) by mouth daily. -     lithium carbonate (LITHOBID) 300 MG CR tablet; Take 3 tablets (900 mg total) by mouth at bedtime.  High risk medication use -     Lithium level -     Basic metabolic panel -     TSH  PTSD (post-traumatic stress disorder) -     busPIRone (BUSPAR) 15 MG tablet; TAKE 1 AND 1/2 TABLETS BY MOUTH TWICE A DAY -     FLUoxetine (PROZAC) 20 MG capsule; Take 1 capsule (20 mg total) by mouth daily. TAKE 1 CAPSULE BY MOUTH EVERY DAY  Generalized anxiety disorder -     busPIRone (BUSPAR) 15 MG tablet; TAKE 1 AND 1/2 TABLETS BY MOUTH TWICE A DAY -     FLUoxetine (PROZAC) 20 MG capsule; Take 1 capsule (20 mg total) by mouth daily. TAKE 1 CAPSULE BY MOUTH EVERY DAY  Insomnia due to other mental disorder     Please see After Visit Summary for patient specific instructions.  Future Appointments  Date Time Provider  Department Center  05/22/2022  9:30 AM Corie Chiquito, PMHNP [facility] None    Orders Placed This Encounter  Procedures   Lithium level   Basic metabolic panel   TSH    -------------------------------

## 2021-11-21 NOTE — Telephone Encounter (Signed)
This may need a PA

## 2021-11-22 NOTE — Telephone Encounter (Signed)
Prior Approval already received for Vraylar 1.5 mg for 30 day supply, 90 day supply isn't an option for PA. Changed back to 30 day supply.

## 2021-11-22 NOTE — Telephone Encounter (Signed)
Noted thanks °

## 2021-12-06 ENCOUNTER — Telehealth: Payer: Self-pay

## 2021-12-30 ENCOUNTER — Other Ambulatory Visit: Payer: Self-pay | Admitting: Psychiatry

## 2021-12-30 DIAGNOSIS — F4310 Post-traumatic stress disorder, unspecified: Secondary | ICD-10-CM

## 2021-12-30 DIAGNOSIS — F411 Generalized anxiety disorder: Secondary | ICD-10-CM

## 2022-02-28 ENCOUNTER — Telehealth: Payer: Self-pay

## 2022-05-22 ENCOUNTER — Ambulatory Visit (INDEPENDENT_AMBULATORY_CARE_PROVIDER_SITE_OTHER): Payer: BC Managed Care – PPO | Admitting: Psychiatry

## 2022-05-22 ENCOUNTER — Encounter: Payer: Self-pay | Admitting: Psychiatry

## 2022-05-22 DIAGNOSIS — F4310 Post-traumatic stress disorder, unspecified: Secondary | ICD-10-CM

## 2022-05-22 DIAGNOSIS — E559 Vitamin D deficiency, unspecified: Secondary | ICD-10-CM

## 2022-05-22 DIAGNOSIS — F411 Generalized anxiety disorder: Secondary | ICD-10-CM

## 2022-05-22 DIAGNOSIS — Z79899 Other long term (current) drug therapy: Secondary | ICD-10-CM

## 2022-05-22 DIAGNOSIS — F319 Bipolar disorder, unspecified: Secondary | ICD-10-CM | POA: Diagnosis not present

## 2022-05-22 DIAGNOSIS — F99 Mental disorder, not otherwise specified: Secondary | ICD-10-CM

## 2022-05-22 DIAGNOSIS — F5105 Insomnia due to other mental disorder: Secondary | ICD-10-CM

## 2022-05-22 MED ORDER — HYDROXYZINE HCL 25 MG PO TABS: 25.0000 mg | ORAL_TABLET | Freq: Four times a day (QID) | ORAL | 2 refills | Status: DC | PRN

## 2022-05-22 MED ORDER — BUSPIRONE HCL 30 MG PO TABS: 30.0000 mg | ORAL_TABLET | Freq: Two times a day (BID) | ORAL | 1 refills | Status: DC

## 2022-05-22 MED ORDER — LITHIUM CARBONATE ER 300 MG PO TBCR: 900.0000 mg | EXTENDED_RELEASE_TABLET | Freq: Every day | ORAL | 1 refills | Status: DC

## 2022-05-22 MED ORDER — FLUOXETINE HCL 20 MG PO CAPS: 20.0000 mg | ORAL_CAPSULE | Freq: Every day | ORAL | 1 refills | Status: DC

## 2022-05-22 NOTE — Progress Notes (Signed)
[name] [id number] 03/23/1986 37 y.o.  Subjective:   Patient ID:  [name] is a 36 y.o. (DOB [birth year]) female.  Chief Complaint:  Chief Complaint  Patient presents with   Anxiety   Sleeping Problem    HPI [name] presents to the office today for follow-up of anxiety, Bipolar Disorder, and sleep disturbance. She reports that she stopped taking Vraylar about 1.5 weeks ago due to severe tremor and restlessness. Mood has been ok. Denies depression. Denies manic symptoms. She reports that she has had increased anxiety recently with "shaking, racing heart." Denies panic attacks. She reports some worry and rumination. Occ periods of low motivation and energy. Concentration has been good. Appetite has been good. She reports difficulty falling asleep and early morning awakenings at times. Denies SI.   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   She reports that in August she and boyfriend of 6 years ended their relationship. Spending more time with friends recently. She has also been spending more time with parents. Maternal Grandfather had heart attack.   Continues to see therapist weekly.   Past medication trials: Depakote-effective.  Tremor. Lithium-effective Lamictal-rash Abilify-drowsiness Seroquel-effective.  Caused weight gain. Symbyax Latuda Saphris-adverse reaction. Cognitive side effects. Rexulti-ineffective Vraylar- Helpful for mood. Caused tremor and akathisia Prozac BuSpar-effective for anxiety Vistaril-sedating  AIMS    Flowsheet Row Office Visit from 10/08/2021 in [facility] Office Visit from 08/13/2021 in [facility]  AIMS Total Score 0 0        Review of Systems:  Review of Systems  Musculoskeletal:  Negative for gait problem.  Neurological:  Positive for tremors.       Occ migraines  Psychiatric/Behavioral:         Please refer to HPI     Medications: I have reviewed the patient's current medications.  Current Outpatient Medications  Medication Sig Dispense Refill   B Complex-C (SUPER B COMPLEX PO) Take by mouth.     Multiple Vitamin (MULTIVITAMIN ADULT PO) Take by mouth.     NURTEC 75 MG TBDP Take by mouth every other day.     SUMAtriptan (IMITREX) 100 MG tablet Take 100 mg by mouth as directed.     TRI-LO-ESTARYLLA 0.18/0.215/0.25 MG-25 MCG tab Take 1 tablet by mouth daily.     busPIRone (BUSPAR) 30 MG tablet Take 1 tablet (30 mg total) by mouth 2 (two) times daily. 180 tablet 1   FLUoxetine (PROZAC) 20 MG capsule Take 1 capsule (20 mg total) by mouth daily. TAKE 1 CAPSULE BY MOUTH EVERY DAY 90 capsule 1   hydrOXYzine (ATARAX) 25 MG tablet Take 1 tablet (25 mg total) by mouth every 6 (six) hours as needed. 90 tablet 2   lithium carbonate (LITHOBID) 300 MG ER tablet Take 3 tablets (900 mg total) by mouth at bedtime. 90 tablet 1   No current facility-administered medications for this visit.    Medication Side Effects: Other: Tremor  Allergies:  Allergies  Allergen Reactions   Lamictal [Lamotrigine]    Sulfa Antibiotics     History reviewed. No pertinent past medical history.  Past Medical History, Surgical history, Social history, and Family history were reviewed and updated as appropriate.   Please see review of systems for further details on the patient's review from today.   Objective:   Physical Exam:  There were no vitals taken for this visit.  Physical Exam Constitutional:  General: She is not in acute distress. Musculoskeletal:        General: No deformity.  Neurological:     Mental Status: She is alert and oriented to person, place, and time.     Coordination: Coordination normal.  Psychiatric:        Attention and Perception: Attention and perception normal. She does not perceive auditory or visual hallucinations.        Mood and Affect: Mood is anxious. Mood is not depressed. Affect  is not labile, blunt, angry or inappropriate.        Speech: Speech normal.        Behavior: Behavior normal.        Thought Content: Thought content normal. Thought content is not paranoid or delusional. Thought content does not include homicidal or suicidal ideation. Thought content does not include homicidal or suicidal plan.        Cognition and Memory: Cognition and memory normal.        Judgment: Judgment normal.     Comments: Insight intact     Lab Review:     Component Value Date/Time   NA 138 11/21/2020 0758   K 4.1 11/21/2020 0758   CL 105 11/21/2020 0758   CO2 25 11/21/2020 0758   GLUCOSE 96 11/21/2020 0758   BUN 8 11/21/2020 0758   CREATININE 0.64 11/21/2020 0758   CALCIUM 9.0 11/21/2020 0758   PROT 5.5 (L) 11/21/2010 0606   ALBUMIN 2.8 (L) 11/21/2010 0606   AST 17 11/21/2010 0606   ALT 9 11/21/2010 0606   ALKPHOS 54 11/21/2010 0606   BILITOT 0.3 11/21/2010 0606   GFRNONAA NOT CALCULATED 11/21/2010 0606   GFRAA NOT CALCULATED 11/21/2010 0606       Component Value Date/Time   WBC 6.0 11/21/2010 0606   RBC 3.51 (L) 11/21/2010 0606   HGB 11.3 (L) 11/21/2010 0606   HCT 33.1 (L) 11/21/2010 0606   PLT 137 (L) 11/21/2010 0606   MCV 94.3 11/21/2010 0606   MCH 32.2 11/21/2010 0606   MCHC 34.1 11/21/2010 0606   RDW 12.2 11/21/2010 0606   LYMPHSABS 2.2 11/18/2010 1246   MONOABS 0.6 11/18/2010 1246   EOSABS 0.1 11/18/2010 1246   BASOSABS 0.0 11/18/2010 1246    Lithium Lvl  Date Value Ref Range Status  11/21/2020 0.8 0.6 - 1.2 mmol/L Final     Lab Results  Component Value Date   VALPROATE 90.7 11/21/2010     .res Assessment: Plan:    Pt seen for 30 minutes and time spent discussing response to Vraylar and having to stop Vraylar about 1.5 weeks ago due to side effects. She reports that she has not experienced any worsening mood and anxiety symptoms since that time. Discussed that Arman Filter has a long half-life and that it has likely not cleared completely, and  to contact office if she experiences worsening mood or anxiety symptoms going forward. Discussed obtaining labs to evaluate for possible adverse effects with Lithium and to obtain Lithium level. Will send in one month supply of Lithium ER 900 mg at bedtime pending lab results.  Will also order Vitamin D level to rule out Vit D deficiency as cause of chronic low energy and seasonal depression.  Will increase Buspar to 30 mg po BID to improve generalized anxiety since pt reports that Buspar has been partially effective for her anxiety at lower dose.  She requests a refill of Hydroxyzine 25 mg as needed for anxiety or insomnia since this has  been helpful in the past when anxiety has caused sleep disturbance. Will continue Prozac 20 mg daily for anxiety and depression.  Offered earlier follow-up as an option. Pt reports that she prefers follow-up in 6 months and will contact office if anxiety does not improve or if any of her symptoms worsen.  Tashera was seen today for anxiety and sleeping problem.  Diagnoses and all orders for this visit:  Bipolar I disorder ([place]) -     lithium carbonate (LITHOBID) 300 MG ER tablet; Take 3 tablets (900 mg total) by mouth at bedtime.  Generalized anxiety disorder -     hydrOXYzine (ATARAX) 25 MG tablet; Take 1 tablet (25 mg total) by mouth every 6 (six) hours as needed. -     busPIRone (BUSPAR) 30 MG tablet; Take 1 tablet (30 mg total) by mouth 2 (two) times daily. -     FLUoxetine (PROZAC) 20 MG capsule; Take 1 capsule (20 mg total) by mouth daily. TAKE 1 CAPSULE BY MOUTH EVERY DAY  High risk medication use -     Lithium level -     Basic metabolic panel -     TSH  Vitamin D deficiency -     VITAMIN D 25 Hydroxy (Vit-D Deficiency, Fractures)  Insomnia due to other mental disorder -     hydrOXYzine (ATARAX) 25 MG tablet; Take 1 tablet (25 mg total) by mouth every 6 (six) hours as needed.  PTSD (post-traumatic stress disorder) -     busPIRone (BUSPAR) 30  MG tablet; Take 1 tablet (30 mg total) by mouth 2 (two) times daily. -     FLUoxetine (PROZAC) 20 MG capsule; Take 1 capsule (20 mg total) by mouth daily. TAKE 1 CAPSULE BY MOUTH EVERY DAY     Please see After Visit Summary for patient specific instructions.  Future Appointments  Date Time Provider [place]  11/20/2022  9:30 AM Thayer Headings, PMHNP [facility] None    Orders Placed This Encounter  Procedures   VITAMIN D 25 Hydroxy (Vit-D Deficiency, Fractures)   Lithium level   Basic metabolic panel   TSH    -------------------------------

## 2022-07-29 ENCOUNTER — Other Ambulatory Visit: Payer: Self-pay | Admitting: Psychiatry

## 2022-07-29 DIAGNOSIS — F319 Bipolar disorder, unspecified: Secondary | ICD-10-CM

## 2022-07-31 NOTE — Telephone Encounter (Signed)
LVM to RC 

## 2022-08-05 ENCOUNTER — Other Ambulatory Visit: Payer: Self-pay | Admitting: Psychiatry

## 2022-08-05 DIAGNOSIS — F5105 Insomnia due to other mental disorder: Secondary | ICD-10-CM

## 2022-08-05 DIAGNOSIS — F411 Generalized anxiety disorder: Secondary | ICD-10-CM

## 2022-08-13 ENCOUNTER — Other Ambulatory Visit: Payer: Self-pay | Admitting: Psychiatry

## 2022-08-13 DIAGNOSIS — F319 Bipolar disorder, unspecified: Secondary | ICD-10-CM

## 2022-09-24 DIAGNOSIS — Z79899 Other long term (current) drug therapy: Secondary | ICD-10-CM | POA: Diagnosis not present

## 2022-09-24 DIAGNOSIS — E559 Vitamin D deficiency, unspecified: Secondary | ICD-10-CM | POA: Diagnosis not present

## 2022-09-25 ENCOUNTER — Encounter: Payer: Self-pay | Admitting: Psychiatry

## 2022-09-25 DIAGNOSIS — F319 Bipolar disorder, unspecified: Secondary | ICD-10-CM

## 2022-09-25 LAB — BASIC METABOLIC PANEL
BUN: 9 mg/dL (ref 7–25)
CO2: 26 mmol/L (ref 20–32)
Calcium: 9.2 mg/dL (ref 8.6–10.2)
Chloride: 106 mmol/L (ref 98–110)
Creat: 0.67 mg/dL (ref 0.50–0.97)
Glucose, Bld: 101 mg/dL — ABNORMAL HIGH (ref 65–99)
Potassium: 4 mmol/L (ref 3.5–5.3)
Sodium: 138 mmol/L (ref 135–146)

## 2022-09-25 LAB — TSH: TSH: 3.39 mIU/L

## 2022-09-25 LAB — LITHIUM LEVEL: Lithium Lvl: 0.9 mmol/L (ref 0.6–1.2)

## 2022-09-25 LAB — VITAMIN D 25 HYDROXY (VIT D DEFICIENCY, FRACTURES): Vit D, 25-Hydroxy: 25 ng/mL — ABNORMAL LOW (ref 30–100)

## 2022-09-25 MED ORDER — LITHIUM CARBONATE ER 300 MG PO TBCR
900.0000 mg | EXTENDED_RELEASE_TABLET | Freq: Every day | ORAL | 1 refills | Status: DC
Start: 2022-09-25 — End: 2023-03-24

## 2022-11-10 ENCOUNTER — Other Ambulatory Visit: Payer: Self-pay | Admitting: Psychiatry

## 2022-11-10 DIAGNOSIS — F4310 Post-traumatic stress disorder, unspecified: Secondary | ICD-10-CM

## 2022-11-10 DIAGNOSIS — F411 Generalized anxiety disorder: Secondary | ICD-10-CM

## 2022-11-11 DIAGNOSIS — G43829 Menstrual migraine, not intractable, without status migrainosus: Secondary | ICD-10-CM | POA: Diagnosis not present

## 2022-11-11 DIAGNOSIS — Z72820 Sleep deprivation: Secondary | ICD-10-CM | POA: Diagnosis not present

## 2022-11-11 NOTE — Telephone Encounter (Signed)
Has appt 8/1, does she have enough to get to appt?

## 2022-11-20 ENCOUNTER — Ambulatory Visit: Payer: BC Managed Care – PPO | Admitting: Psychiatry

## 2022-12-04 ENCOUNTER — Other Ambulatory Visit: Payer: Self-pay | Admitting: Psychiatry

## 2022-12-04 DIAGNOSIS — F411 Generalized anxiety disorder: Secondary | ICD-10-CM

## 2022-12-04 DIAGNOSIS — F4310 Post-traumatic stress disorder, unspecified: Secondary | ICD-10-CM

## 2022-12-05 DIAGNOSIS — R051 Acute cough: Secondary | ICD-10-CM | POA: Diagnosis not present

## 2022-12-05 DIAGNOSIS — J029 Acute pharyngitis, unspecified: Secondary | ICD-10-CM | POA: Diagnosis not present

## 2022-12-05 DIAGNOSIS — M791 Myalgia, unspecified site: Secondary | ICD-10-CM | POA: Diagnosis not present

## 2022-12-05 DIAGNOSIS — R1110 Vomiting, unspecified: Secondary | ICD-10-CM | POA: Diagnosis not present

## 2022-12-19 DIAGNOSIS — G43909 Migraine, unspecified, not intractable, without status migrainosus: Secondary | ICD-10-CM | POA: Diagnosis not present

## 2023-01-03 ENCOUNTER — Other Ambulatory Visit: Payer: Self-pay | Admitting: Psychiatry

## 2023-01-03 DIAGNOSIS — F411 Generalized anxiety disorder: Secondary | ICD-10-CM

## 2023-01-03 DIAGNOSIS — F4310 Post-traumatic stress disorder, unspecified: Secondary | ICD-10-CM

## 2023-02-03 ENCOUNTER — Other Ambulatory Visit: Payer: Self-pay | Admitting: Psychiatry

## 2023-02-03 DIAGNOSIS — F4310 Post-traumatic stress disorder, unspecified: Secondary | ICD-10-CM

## 2023-02-03 DIAGNOSIS — F411 Generalized anxiety disorder: Secondary | ICD-10-CM

## 2023-02-18 DIAGNOSIS — G43901 Migraine, unspecified, not intractable, with status migrainosus: Secondary | ICD-10-CM | POA: Diagnosis not present

## 2023-02-24 DIAGNOSIS — F319 Bipolar disorder, unspecified: Secondary | ICD-10-CM | POA: Diagnosis not present

## 2023-02-24 DIAGNOSIS — G43829 Menstrual migraine, not intractable, without status migrainosus: Secondary | ICD-10-CM | POA: Diagnosis not present

## 2023-02-26 ENCOUNTER — Encounter: Payer: Self-pay | Admitting: Neurology

## 2023-03-04 ENCOUNTER — Encounter: Payer: Self-pay | Admitting: Psychiatry

## 2023-03-06 ENCOUNTER — Other Ambulatory Visit: Payer: Self-pay | Admitting: Psychiatry

## 2023-03-06 DIAGNOSIS — F411 Generalized anxiety disorder: Secondary | ICD-10-CM

## 2023-03-06 DIAGNOSIS — F4310 Post-traumatic stress disorder, unspecified: Secondary | ICD-10-CM

## 2023-03-06 NOTE — Telephone Encounter (Signed)
Sent MyChart message to schedule. Past due.

## 2023-03-10 NOTE — Telephone Encounter (Signed)
Please call to schedule FU. Did not respond to MyChart message.

## 2023-03-10 NOTE — Telephone Encounter (Signed)
Lvm for pt to call and schedule

## 2023-03-18 ENCOUNTER — Telehealth (INDEPENDENT_AMBULATORY_CARE_PROVIDER_SITE_OTHER): Payer: Self-pay | Admitting: Psychiatry

## 2023-03-18 DIAGNOSIS — Z91199 Patient's noncompliance with other medical treatment and regimen due to unspecified reason: Secondary | ICD-10-CM

## 2023-03-18 NOTE — Progress Notes (Signed)
CARNATION KRAS [id number] 06/25/1985 37 y.o.  Virtual Visit via Video Note  Attempted to connect with pt for scheduled video visit at 8:30 am. Pt was not logged in at start of apt and was then sent video link via text message in case pt was having difficulty logging into My Chart. Attempted to contact pt via telephone- call ended after several rings. Provider remained logged in until 8:45 am and then disconnected.

## 2023-03-24 ENCOUNTER — Other Ambulatory Visit: Payer: Self-pay | Admitting: Psychiatry

## 2023-03-24 DIAGNOSIS — F319 Bipolar disorder, unspecified: Secondary | ICD-10-CM

## 2023-04-07 ENCOUNTER — Other Ambulatory Visit: Payer: Self-pay | Admitting: Psychiatry

## 2023-04-07 DIAGNOSIS — F319 Bipolar disorder, unspecified: Secondary | ICD-10-CM

## 2023-04-10 NOTE — Progress Notes (Unsigned)
NEUROLOGY CONSULTATION NOTE  [name] MRN: [id number] DOB: 21-Jul-1985  Referring provider: Shireen Quan, DO  Primary care provider: [facility]  Reason for consult:  migraines  Assessment/Plan:   Migraine without aura, without status migrainosus, not intractable  Migraine prevention:  She is getting new insurance on 1/1.  At that time, will plan to start Emgality.  She will taper off topiramate. Migraine rescue:  She will try rizatriptan 10mg ; Zofran 4mg  for nausea Limit use of pain relievers to no more than 2 days out of week to prevent risk of rebound or medication-overuse headache. Keep headache diary Follow up 5 months.     Subjective:  [name] is a 37 year old right-handed female with Bipolar Disorder, depression and anxiety who presents for migraines.  History supplemented by referring provider's note.  Onset:  teenager, progressively gotten worse since her 33s Location:  usually right-sided, retro-orbital Quality:  throbbing, sometimes stabbing Intensity:  3-6/10.   Aura:  absent Prodrome:  very dull headache Associated symptoms:  Photophobia, phonophobia, nausea, lethargy, sometimes vomiting.  She denies associated visual disturbance, autonomic symptoms, unilateral numbness or weakness. Duration:  all day, sometimes into next day (2 hours with sumatriptan but avoids sumatriptan due to causing rebound headache) Frequency:  7 to 8 a month (3 to 4 are severe) Frequency of abortive medication: none Triggers:  menstrual cycle Relieving factors:  resting in dark room with cold face mask Activity:  aggravates  Past NSAIDS/analgesics:  ibuprofen, Tylenol Past abortive triptans:  sumatriptan 100mg  tab Past abortive ergotamine:  none Past muscle relaxants:  none Past anti-emetic:  Zofran 4mg  Past antihypertensive medications:  propranolol (hypotension) Past antidepressant medications:  wellbutrin, Trintellix  Past  anticonvulsant medications:  none Past anti-CGRP:  Nurtec 75mg  every other day (lost efficacy)  Current NSAIDS/analgesics:  none Current triptans:  none Current ergotamine:  none Current anti-emetic:  none Current muscle relaxants:  none Current Antihypertensive medications:  none Current Antidepressant medications:  fluoxetine 20mg  daily Current Anticonvulsant medications:  topiramate 50mg  twice daily Current anti-CGRP:  none Current Vitamins/Herbal/Supplements:  magnesium, L-theanine, vit D Current Antihistamines/Decongestants:  none Other therapy:  cold face mask Birth control:  none Other medications:  Lithium 600mg  daily, hydroxyzine, BuSpar   Caffeine:  1 cup of coffee sporadically, sometimes soda Diet:  Drinks a lot of water.  Sometimes skips meals. Exercise:  not routine Depression:  yes (fatigue/lack of motivation); Anxiety:  stable Sleep hygiene:  for past year, keeps waking up in middle of night, occasionally more than once.  Sometimes gets up to go to the bathroom.  Able to fall back asleep Family history of headache:  mom (menstrual migraines)      PAST MEDICAL HISTORY: No past medical history on file.  PAST SURGICAL HISTORY: Past Surgical History:  Procedure Laterality Date   LUNG SURGERY      MEDICATIONS: Current Outpatient Medications on File Prior to Visit  Medication Sig Dispense Refill   B Complex-C (SUPER B COMPLEX PO) Take by mouth.     busPIRone (BUSPAR) 30 MG tablet TAKE 1 TABLET BY MOUTH TWICE A DAY 60 tablet 0   FLUoxetine (PROZAC) 20 MG capsule TAKE 1 CAPSULE (20 MG TOTAL) BY MOUTH EVERY DAY 90 capsule 1   hydrOXYzine (ATARAX) 25 MG tablet TAKE 1 TABLET BY MOUTH EVERY 6 HOURS AS NEEDED. 90 tablet 4   lithium carbonate (LITHOBID) 300 MG ER tablet TAKE 3 TABLETS (900 MG TOTAL) BY MOUTH AT BEDTIME.  90 tablet 0   Multiple Vitamin (MULTIVITAMIN ADULT PO) Take by mouth.     NURTEC 75 MG TBDP Take by mouth every other day.     SUMAtriptan (IMITREX)  100 MG tablet Take 100 mg by mouth as directed.     TRI-LO-ESTARYLLA 0.18/0.215/0.25 MG-25 MCG tab Take 1 tablet by mouth daily.     No current facility-administered medications on file prior to visit.    ALLERGIES: Allergies  Allergen Reactions   Lamictal [Lamotrigine]    Sulfa Antibiotics     FAMILY HISTORY: Family History  Problem Relation Age of Onset   Hypertension Mother    Ulcerative colitis Father    Drug abuse Maternal Aunt    Drug abuse Maternal Uncle    Alcohol abuse Maternal Grandfather    Heart attack Maternal Grandfather    Alcohol abuse Maternal Grandmother    Drug abuse Cousin     Objective:  Blood pressure (!) 108/54, pulse 71, height 5\' 5"  (1.651 m), weight 182 lb 9.6 oz (82.8 kg), SpO2 98%. General: No acute distress.  Patient appears well-groomed.   Head:  Normocephalic/atraumatic Eyes:  fundi examined but not visualized Neck: supple, no paraspinal tenderness, full range of motion Heart: regular rate and rhythm Neurological Exam: Mental status: alert and oriented to person, place, and time, speech fluent and not dysarthric, language intact. Cranial nerves: CN I: not tested CN II: pupils equal, round and reactive to light, visual fields intact CN III, IV, VI:  full range of motion, no nystagmus, no ptosis CN V: facial sensation intact. CN VII: upper and lower face symmetric CN VIII: hearing intact CN IX, X: gag intact, uvula midline CN XI: sternocleidomastoid and trapezius muscles intact CN XII: tongue midline Bulk & Tone: normal, no fasciculations. Motor:  muscle strength 5/5 throughout Sensation:  Pinprick and vibratory sensation intact. Deep Tendon Reflexes:  2+ throughout,  toes downgoing.   Finger to nose testing:  Without dysmetria.   Gait:  Normal station and stride.  Romberg negative.    Thank you for allowing me to take part in the care of this patient.  Shon Millet, DO  CC:  Shireen Quan, DO

## 2023-04-13 ENCOUNTER — Other Ambulatory Visit: Payer: Self-pay | Admitting: Neurology

## 2023-04-13 ENCOUNTER — Ambulatory Visit (INDEPENDENT_AMBULATORY_CARE_PROVIDER_SITE_OTHER): Payer: Self-pay | Admitting: Neurology

## 2023-04-13 ENCOUNTER — Encounter: Payer: Self-pay | Admitting: Neurology

## 2023-04-13 VITALS — BP 108/54 | HR 71 | Ht 65.0 in | Wt 182.6 lb

## 2023-04-13 DIAGNOSIS — G43009 Migraine without aura, not intractable, without status migrainosus: Secondary | ICD-10-CM

## 2023-04-13 MED ORDER — RIZATRIPTAN BENZOATE 10 MG PO TABS: 10.0000 mg | ORAL_TABLET | ORAL | 5 refills | Status: AC | PRN

## 2023-04-13 MED ORDER — ONDANSETRON HCL 4 MG PO TABS: 4.0000 mg | ORAL_TABLET | Freq: Three times a day (TID) | ORAL | 5 refills | Status: AC | PRN

## 2023-04-13 NOTE — Patient Instructions (Signed)
  Once you get your insurance info, send a picture of the front and back of your new insurance card via MyChart and let us know.  Then I will prescribe Emgality injection every 28 days Take rizatriptan 10mg  at earliest onset of headache.  May repeat dose once in 2 hours if needed.  Maximum 2 tablets in 24 hours. Take ondansetron for nausea Limit use of pain relievers to no more than 2 days out of the week.  These medications include acetaminophen, NSAIDs (ibuprofen/Advil/Motrin, naproxen/Aleve, triptans (Imitrex/sumatriptan), Excedrin, and narcotics.  This will help reduce risk of rebound headaches. Be aware of common food triggers: Routine exercise Stay adequately hydrated (aim for 64 oz water daily) Keep headache diary Maintain proper stress management Maintain proper sleep hygiene Do not skip meals Consider supplements:  magnesium citrate 400mg  daily, riboflavin 400mg  daily, coenzyme Q10 300mg  daily

## 2023-04-14 ENCOUNTER — Other Ambulatory Visit (HOSPITAL_COMMUNITY): Payer: Self-pay

## 2023-04-14 ENCOUNTER — Telehealth: Payer: Self-pay | Admitting: Pharmacy Technician

## 2023-04-14 NOTE — Telephone Encounter (Signed)
Pharmacy Patient Advocate Encounter  Received notification from [facility] that Prior Authorization for RIZATRIPTAN 10MG  has been APPROVED from 12.24.24 to 12.24.25. Ran test claim, Copay is $10. This test claim was processed through [facility]- copay amounts may vary at other pharmacies due to pharmacy/plan contracts, or as the patient moves through the different stages of their insurance plan.   PA #/Case ID/Reference #: [id number]

## 2023-04-14 NOTE — Telephone Encounter (Signed)
Pharmacy Patient Advocate Encounter   Received notification from Physician's Office that prior authorization for MAXALT 10 is required/requested.   Insurance verification completed.   The patient is insured through [employer] .   Per test claim: PA required; PA submitted to above mentioned insurance via CoverMyMeds Key/confirmation #/EOC [id number] Status is pending

## 2023-04-14 NOTE — Telephone Encounter (Signed)
 PA has been submitted, and telephone encounter has been created. PA IS APPROVED

## 2023-04-17 ENCOUNTER — Encounter: Payer: Self-pay | Admitting: Psychiatry

## 2023-04-17 ENCOUNTER — Telehealth (INDEPENDENT_AMBULATORY_CARE_PROVIDER_SITE_OTHER): Payer: BC Managed Care – PPO | Admitting: Psychiatry

## 2023-04-17 DIAGNOSIS — F319 Bipolar disorder, unspecified: Secondary | ICD-10-CM | POA: Diagnosis not present

## 2023-04-17 DIAGNOSIS — F411 Generalized anxiety disorder: Secondary | ICD-10-CM | POA: Diagnosis not present

## 2023-04-17 DIAGNOSIS — F99 Mental disorder, not otherwise specified: Secondary | ICD-10-CM

## 2023-04-17 DIAGNOSIS — F5105 Insomnia due to other mental disorder: Secondary | ICD-10-CM

## 2023-04-17 DIAGNOSIS — F4310 Post-traumatic stress disorder, unspecified: Secondary | ICD-10-CM

## 2023-04-17 DIAGNOSIS — Z79899 Other long term (current) drug therapy: Secondary | ICD-10-CM

## 2023-04-17 MED ORDER — FLUOXETINE HCL 20 MG PO CAPS: ORAL_CAPSULE | ORAL | 1 refills | Status: AC

## 2023-04-17 MED ORDER — HYDROXYZINE HCL 25 MG PO TABS: 25.0000 mg | ORAL_TABLET | Freq: Four times a day (QID) | ORAL | 4 refills | Status: AC | PRN

## 2023-04-17 MED ORDER — BUSPIRONE HCL 30 MG PO TABS: 30.0000 mg | ORAL_TABLET | Freq: Two times a day (BID) | ORAL | 1 refills | Status: AC

## 2023-04-17 MED ORDER — LITHIUM CARBONATE ER 300 MG PO TBCR: 900.0000 mg | EXTENDED_RELEASE_TABLET | Freq: Every day | ORAL | 0 refills | Status: AC

## 2023-04-17 NOTE — Progress Notes (Signed)
[name] [id number] 04/21/1986 37 y.o.  Virtual Visit via Video Note  I connected with pt @ on 04/17/23 at 10:30 AM EST by a video enabled telemedicine application and verified that I am speaking with the correct person using two identifiers.   I discussed the limitations of evaluation and management by telemedicine and the availability of in person appointments. The patient expressed understanding and agreed to proceed.  I discussed the assessment and treatment plan with the patient. The patient was provided an opportunity to ask questions and all were answered. The patient agreed with the plan and demonstrated an understanding of the instructions.   The patient was advised to call back or seek an in-person evaluation if the symptoms worsen or if the condition fails to improve as anticipated.  I provided 25 minutes of non-face-to-face time during this encounter.  The patient was located at home.  The provider was located at home.   Corie Chiquito, PMHNP   Subjective:   Patient ID:  [name] is a 37 y.o. (DOB 05-Oct-1985) female.  Chief Complaint:  Chief Complaint  Patient presents with   Fatigue   Follow-up    Anxiety, mood disturbance, and sleep disturbance    HPI [name] presents for follow-up of mood disturbance, anxiety, and insomnia. She reports that she continues to have difficulty with energy and motivation. She was started on Topamax and felt that it made her more tired and exacerbated headaches. She has been trying to be more consistent with Vitamin D and B complex. She reports that fatigue has been worse over the last few months.   She reports, "I don't feel really sad, it's just hard to get going." Sleep has been fragmented with multiple awakenings. Will wake up and "feel really activated." Sometimes able to return to sleep and other times is awake for over an hour. Some nightmares. Reports sleeping about 8 hours total.  Denies any manic  symptoms. Concentration "depends." Had decreased appetite with Topamax. She reports some diminished interest and enjoyment in things- "like a damper on things." Denies SI.   Denies any PTSD symptoms other than nightmares and exaggerated startle response. She reports that her anxiety is "the same. Still significantly there, but manageable." Denies panic. She reports that increase in Buspar seems to help some with anxiety.   Recently got out of relationship.   Work has been going ok.   Working for [employer].   Past medication trials: Depakote-effective.  Tremor. Lithium-effective Lamictal-rash Abilify-drowsiness Seroquel-effective.  Caused weight gain. Symbyax Latuda Saphris-adverse reaction. Cognitive side effects. Rexulti-ineffective Vraylar- Helpful for mood. Caused tremor and akathisia. Prozac BuSpar-effective for anxiety Vistaril-sedating    Review of Systems:  Review of Systems  Respiratory:         Dyspnea on exertion  Musculoskeletal:  Negative for gait problem.  Neurological:  Positive for tremors and headaches.       Tremor is consistent with baseline  Psychiatric/Behavioral:         Please refer to HPI    Medications: I have reviewed the patient's current medications.  Current Outpatient Medications  Medication Sig Dispense Refill   B Complex-C (SUPER B COMPLEX PO) Take by mouth.     Cholecalciferol (VITAMIN D) 50 MCG (2000 UT) CAPS Take by mouth.     Multiple Vitamin (MULTIVITAMIN ADULT PO) Take by mouth.     ondansetron (ZOFRAN) 4 MG tablet Take 1 tablet (4 mg total) by mouth every 8 (eight) hours as needed. 20  tablet 5   busPIRone (BUSPAR) 30 MG tablet Take 1 tablet (30 mg total) by mouth 2 (two) times daily. 180 tablet 1   FLUoxetine (PROZAC) 20 MG capsule TAKE 1 CAPSULE (20 MG TOTAL) BY MOUTH EVERY DAY 90 capsule 1   hydrOXYzine (ATARAX) 25 MG tablet Take 1 tablet (25 mg total) by mouth every 6 (six) hours as needed. 90 tablet 4   lithium carbonate (LITHOBID)  300 MG ER tablet Take 3 tablets (900 mg total) by mouth at bedtime. 90 tablet 0   NURTEC 75 MG TBDP Take by mouth every other day. (Patient not taking: Reported on 04/17/2023)     rizatriptan (MAXALT) 10 MG tablet Take 1 tablet (10 mg total) by mouth as needed for migraine. May repeat in 2 hours if needed 10 tablet 5   SUMAtriptan (IMITREX) 100 MG tablet Take 100 mg by mouth as directed. (Patient not taking: Reported on 04/17/2023)     No current facility-administered medications for this visit.    Medication Side Effects: Other: Possible fatigue  Allergies:  Allergies  Allergen Reactions   Lamictal [Lamotrigine]    Sulfa Antibiotics     Past Medical History:  Diagnosis Date   Anxiety    Bipolar 1 disorder (HCC)    Migraine     Family History  Problem Relation Age of Onset   Migraines Mother    Hypertension Mother    Ulcerative colitis Father    Drug abuse Maternal Aunt    Drug abuse Maternal Uncle    Alcohol abuse Maternal Grandmother    Alcohol abuse Maternal Grandfather    Heart attack Maternal Grandfather    Alzheimer's disease Paternal Grandmother    Drug abuse Cousin     Social History   Socioeconomic History   Marital status: Single    Spouse name: Not on file   Number of children: Not on file   Years of education: Not on file   Highest education level: Not on file  Occupational History   Not on file  Tobacco Use   Smoking status: Former   Smokeless tobacco: Never  Vaping Use   Vaping status: Never Used  Substance and Sexual Activity   Alcohol use: Never   Drug use: Never   Sexual activity: Yes    Birth control/protection: None  Other Topics Concern   Not on file  Social History Narrative   Are you right handed or left handed? Right handed   Are you currently employed ?    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   Who lives with you?    What type of home do you live in: 1 story or 2 story?        Social  Drivers of Corporate investment banker Strain: Not on file  Food Insecurity: Not on file  Transportation Needs: Not on file  Physical Activity: Not on file  Stress: Not on file  Social Connections: Not on file  Intimate Partner Violence: Not on file    Past Medical History, Surgical history, Social history, and Family history were reviewed and updated as appropriate.   Please see review of systems for further details on the patient's review from today.   Objective:   Physical Exam:  There were no vitals taken for this visit.  Physical Exam Neurological:     Mental Status: She is alert and oriented to person, place, and time.     Cranial  Nerves: No dysarthria.  Psychiatric:        Attention and Perception: Attention and perception normal.        Mood and Affect: Mood normal.        Speech: Speech normal.        Behavior: Behavior is cooperative.        Thought Content: Thought content normal. Thought content is not paranoid or delusional. Thought content does not include homicidal or suicidal ideation. Thought content does not include homicidal or suicidal plan.        Cognition and Memory: Cognition and memory normal.        Judgment: Judgment normal.     Comments: Insight intact     Lab Review:     Component Value Date/Time   NA 138 09/24/2022 0824   K 4.0 09/24/2022 0824   CL 106 09/24/2022 0824   CO2 26 09/24/2022 0824   GLUCOSE 101 (H) 09/24/2022 0824   BUN 9 09/24/2022 0824   CREATININE 0.67 09/24/2022 0824   CALCIUM 9.2 09/24/2022 0824   PROT 5.5 (L) 11/21/2010 0606   ALBUMIN 2.8 (L) 11/21/2010 0606   AST 17 11/21/2010 0606   ALT 9 11/21/2010 0606   ALKPHOS 54 11/21/2010 0606   BILITOT 0.3 11/21/2010 0606   GFRNONAA NOT CALCULATED 11/21/2010 0606   GFRAA NOT CALCULATED 11/21/2010 0606       Component Value Date/Time   WBC 6.0 11/21/2010 0606   RBC 3.51 (L) 11/21/2010 0606   HGB 11.3 (L) 11/21/2010 0606   HCT 33.1 (L) 11/21/2010 0606   PLT 137 (L)  11/21/2010 0606   MCV 94.3 11/21/2010 0606   MCH 32.2 11/21/2010 0606   MCHC 34.1 11/21/2010 0606   RDW 12.2 11/21/2010 0606   LYMPHSABS 2.2 11/18/2010 1246   MONOABS 0.6 11/18/2010 1246   EOSABS 0.1 11/18/2010 1246   BASOSABS 0.0 11/18/2010 1246    Lithium Lvl  Date Value Ref Range Status  09/24/2022 0.9 0.6 - 1.2 mmol/L Final     Lab Results  Component Value Date   VALPROATE 90.7 11/21/2010     .res Assessment: Plan:    29 minutes spent dedicated to the care of this patient on the date of this encounter to include pre-visit review of records, ordering of medication, post visit documentation, and face-to-face time with the patient discussing continued fatigue despite not experiencing significant depression currently. Discussed including CBC with labs to rule out anemia as a cause of chronic fatigue since she does not appear to have had a recent CBC and past hemoglobin and hematocrit were low. Will also include Hemoglobin A1C since glucose levels have been slightly outside of normal range if she were fasting at time of blood draws.   Continue Lithium ER 900 mg at bedtime for mood stabilization.  Continue Prozac 20 mg daily for depression and anxiety. Continue Buspar 30 mg po BID for anxiety.  Continue Hydroxyzine 25 mg every 6 hours as needed for anxiety or insomnia.  Pt to follow-up in 6 months or sooner if clinically indicated.  Patient advised to contact office with any questions, adverse effects, or acute worsening in signs and symptoms.   Kiann was seen today for fatigue and follow-up.  Diagnoses and all orders for this visit:  Generalized anxiety disorder -     busPIRone (BUSPAR) 30 MG tablet; Take 1 tablet (30 mg total) by mouth 2 (two) times daily. -     FLUoxetine (PROZAC) 20 MG capsule; TAKE 1  CAPSULE (20 MG TOTAL) BY MOUTH EVERY DAY -     hydrOXYzine (ATARAX) 25 MG tablet; Take 1 tablet (25 mg total) by mouth every 6 (six) hours as needed.  PTSD (post-traumatic  stress disorder) -     busPIRone (BUSPAR) 30 MG tablet; Take 1 tablet (30 mg total) by mouth 2 (two) times daily. -     FLUoxetine (PROZAC) 20 MG capsule; TAKE 1 CAPSULE (20 MG TOTAL) BY MOUTH EVERY DAY  Insomnia due to other mental disorder -     hydrOXYzine (ATARAX) 25 MG tablet; Take 1 tablet (25 mg total) by mouth every 6 (six) hours as needed.  Bipolar I disorder (HCC) -     lithium carbonate (LITHOBID) 300 MG ER tablet; Take 3 tablets (900 mg total) by mouth at bedtime.  High risk medication use -     Lithium level -     Hemoglobin A1c -     Basic metabolic panel -     CBC     Please see After Visit Summary for patient specific instructions.  Future Appointments  Date Time Provider Department Center  09/28/2023 10:30 AM Drema Dallas, DO [facility] None    Orders Placed This Encounter  Procedures   Lithium level   Hemoglobin A1c   Basic metabolic panel   CBC      -------------------------------

## 2023-04-23 ENCOUNTER — Encounter: Payer: Self-pay | Admitting: Neurology

## 2023-05-01 ENCOUNTER — Telehealth: Payer: Self-pay | Admitting: Neurology

## 2023-05-01 NOTE — Telephone Encounter (Signed)
 Pt called to make sure we got her new insurance card loaded. She is waiting on the PA for Emgality. She would like a call back from sheena regarding this

## 2023-05-02 ENCOUNTER — Other Ambulatory Visit: Payer: Self-pay | Admitting: Psychiatry

## 2023-05-02 DIAGNOSIS — F319 Bipolar disorder, unspecified: Secondary | ICD-10-CM

## 2023-05-04 ENCOUNTER — Telehealth: Payer: Self-pay | Admitting: Pharmacy Technician

## 2023-05-04 ENCOUNTER — Other Ambulatory Visit (HOSPITAL_COMMUNITY): Payer: Self-pay

## 2023-05-04 NOTE — Telephone Encounter (Signed)
 PA request has been Submitted. New Encounter created for follow up. For additional info see Pharmacy Prior Auth telephone encounter from 05/04/23.

## 2023-05-04 NOTE — Telephone Encounter (Signed)
 Pharmacy Patient Advocate Encounter   Received notification from Pt Calls Messages that prior authorization for Emgality  120MG /ML auto-injectors is required/requested.   Insurance verification completed.   The patient is insured through [facility] .   Per test claim: PA required; PA submitted to above mentioned insurance via CoverMyMeds Key/confirmation #/EOC  [id number] Status is pending

## 2023-05-14 ENCOUNTER — Other Ambulatory Visit: Payer: Self-pay | Admitting: Neurology

## 2023-05-14 ENCOUNTER — Encounter: Payer: Self-pay | Admitting: Neurology

## 2023-05-14 ENCOUNTER — Other Ambulatory Visit (HOSPITAL_COMMUNITY): Payer: Self-pay

## 2023-05-14 MED ORDER — EMGALITY 120 MG/ML ~~LOC~~ SOAJ: 240.0000 mg | SUBCUTANEOUS | 0 refills | Status: DC

## 2023-05-14 NOTE — Telephone Encounter (Signed)
Pharmacy Patient Advocate Encounter  Received notification from [facility] that Prior Authorization for Emgality 120mg /ml [place] SOAJ has been APPROVED from 05/04/23 to 05/03/24. Ran test claim, Copay is $35. This test claim was processed through [facility]- copay amounts may vary at other pharmacies due to pharmacy/plan contracts, or as the patient moves through the different stages of their insurance plan.   PA #/Case ID/Reference #: [id number]

## 2023-05-14 NOTE — Telephone Encounter (Signed)
Per Patient,  Good afternoon. I'm reaching out regarding my Emgalty prescription. I got a message from my insurance that it was approved but it hasn't gone to my pharmacy yet. Thank you    Pa team please advise

## 2023-05-14 NOTE — Telephone Encounter (Signed)
Patient advised via mychart, Script sent to the pharmacy.

## 2023-05-14 NOTE — Addendum Note (Signed)
Addended by: Leida Lauth on: 05/14/2023 02:45 PM   Modules accepted: Orders

## 2023-05-21 NOTE — Telephone Encounter (Signed)
This PA request has resolved with an APPROVAL. Please see 1.13.25 encounter.

## 2023-05-26 ENCOUNTER — Telehealth: Payer: Self-pay

## 2023-05-26 NOTE — Telephone Encounter (Signed)
 Medication Samples have been provided to the patient.  Drug name: Emgality        Strength: 120 mg        Qty: 1  LOT: [id number]  Exp.Date: 10/27/24  Dosing instructions: every 28 days  The patient has been instructed regarding the correct time, dose, and frequency of taking this medication, including desired effects and most common side effects.   [name] 2:36 PM 05/26/2023

## 2023-06-09 ENCOUNTER — Other Ambulatory Visit: Payer: Self-pay | Admitting: Neurology

## 2023-07-10 DIAGNOSIS — Z79899 Other long term (current) drug therapy: Secondary | ICD-10-CM | POA: Diagnosis not present

## 2023-07-11 LAB — CBC
HCT: 36.4 % (ref 35.0–45.0)
Hemoglobin: 12.4 g/dL (ref 11.7–15.5)
MCH: 31.9 pg (ref 27.0–33.0)
MCHC: 34.1 g/dL (ref 32.0–36.0)
MCV: 93.6 fL (ref 80.0–100.0)
MPV: 10.8 fL (ref 7.5–12.5)
Platelets: 242 10*3/uL (ref 140–400)
RBC: 3.89 10*6/uL (ref 3.80–5.10)
RDW: 11.7 % (ref 11.0–15.0)
WBC: 8.1 10*3/uL (ref 3.8–10.8)

## 2023-07-11 LAB — BASIC METABOLIC PANEL WITH GFR
BUN: 12 mg/dL (ref 7–25)
CO2: 27 mmol/L (ref 20–32)
Calcium: 9.9 mg/dL (ref 8.6–10.2)
Chloride: 104 mmol/L (ref 98–110)
Creat: 0.74 mg/dL (ref 0.50–0.97)
Glucose, Bld: 125 mg/dL — ABNORMAL HIGH (ref 65–99)
Potassium: 3.7 mmol/L (ref 3.5–5.3)
Sodium: 141 mmol/L (ref 135–146)

## 2023-07-11 LAB — LITHIUM LEVEL: Lithium Lvl: 0.6 mmol/L (ref 0.6–1.2)

## 2023-07-11 LAB — HEMOGLOBIN A1C
Hgb A1c MFr Bld: 4.9 %{Hb} (ref <5.7)
Mean Plasma Glucose: 94 mg/dL
eAG (mmol/L): 5.2 mmol/L

## 2023-08-19 NOTE — Telephone Encounter (Signed)
 No further review needed

## 2023-09-25 NOTE — Progress Notes (Deleted)
 NEUROLOGY FOLLOW UP OFFICE NOTE  DALANA PFAHLER [id number]  Assessment/Plan:   Migraine without aura, without status migrainosus, not intractable  Migraine prevention:  Emgality  *** Migraine rescue:  Rizatriptan  10mg ; Zofran  4mg  for nausea Limit use of pain relievers to no more than 9 days out of the month to prevent risk of rebound or medication-overuse headache. Keep headache diary Follow up 6 months     Subjective:  Nieve D Cashin is a 38 year old right-handed female with Bipolar Disorder, depression and anxiety who follows up for migraines.  UPDATE: Started Emgality  and tapered off topiramate. Intensity:  *** Duration:  *** Frequency:  *** Frequency of abortive medication: *** Current NSAIDS/analgesics:  none Current triptans:  none Current ergotamine:  none Current anti-emetic:  none Current muscle relaxants:  none Current Antihypertensive medications:  none Current Antidepressant medications:  fluoxetine  20mg  daily Current Anticonvulsant medications: none Current anti-CGRP:  none Current Vitamins/Herbal/Supplements:  magnesium, L-theanine, vit D Current Antihistamines/Decongestants:  none Other therapy:  cold face mask Birth control:  none Other medications:  Lithium  600mg  daily, hydroxyzine , BuSpar    Caffeine:  1 cup of coffee sporadically, sometimes soda Diet:  Drinks a lot of water.  Sometimes skips meals. Exercise:  not routine Depression:  yes (fatigue/lack of motivation); Anxiety:  stable Sleep hygiene:  for past year, keeps waking up in middle of night, occasionally more than once.  Sometimes gets up to go to the bathroom.  Able to fall back asleep  HISTORY: Onset:  teenager, progressively gotten worse since her 27s Location:  usually right-sided, retro-orbital Quality:  throbbing, sometimes stabbing Intensity:  3-6/10.   Aura:  absent Prodrome:  very dull headache Associated symptoms:  Photophobia, phonophobia, nausea, lethargy, sometimes  vomiting.  She denies associated visual disturbance, autonomic symptoms, unilateral numbness or weakness. Duration:  all day, sometimes into next day (2 hours with sumatriptan but avoids sumatriptan due to causing rebound headache) Frequency:  7 to 8 a month (3 to 4 are severe) Frequency of abortive medication: none Triggers:  menstrual cycle Relieving factors:  resting in dark room with cold face mask Activity:  aggravates  Past NSAIDS/analgesics:  ibuprofen, Tylenol  Past abortive triptans:  sumatriptan 100mg  tab Past abortive ergotamine:  none Past muscle relaxants:  none Past anti-emetic:  Zofran  4mg  Past antihypertensive medications:  propranolol (hypotension) Past antidepressant medications:  wellbutrin, Trintellix  Past anticonvulsant medications:  topiramate 50mg  BID Past anti-CGRP:  Nurtec 75mg  every other day (lost efficacy)   Family history of headache:  mom (menstrual migraines)  PAST MEDICAL HISTORY: Past Medical History:  Diagnosis Date   Anxiety    Bipolar 1 disorder (HCC)    Migraine     MEDICATIONS: Current Outpatient Medications on File Prior to Visit  Medication Sig Dispense Refill   B Complex-C (SUPER B COMPLEX PO) Take by mouth.     busPIRone  (BUSPAR ) 30 MG tablet Take 1 tablet (30 mg total) by mouth 2 (two) times daily. 180 tablet 1   Cholecalciferol (VITAMIN D ) 50 MCG (2000 UT) CAPS Take by mouth.     FLUoxetine  (PROZAC ) 20 MG capsule TAKE 1 CAPSULE (20 MG TOTAL) BY MOUTH EVERY DAY 90 capsule 1   Galcanezumab -gnlm (EMGALITY ) 120 MG/ML SOAJ Inject 120 mg into the skin every 28 (twenty-eight) days. 1 mL 11   hydrOXYzine  (ATARAX ) 25 MG tablet Take 1 tablet (25 mg total) by mouth every 6 (six) hours as needed. 90 tablet 4   lithium  carbonate (LITHOBID ) 300 MG ER tablet Take 3  tablets (900 mg total) by mouth at bedtime. 90 tablet 0   Multiple Vitamin (MULTIVITAMIN ADULT PO) Take by mouth.     NURTEC 75 MG TBDP Take by mouth every other day. (Patient not  taking: Reported on 04/17/2023)     ondansetron  (ZOFRAN ) 4 MG tablet Take 1 tablet (4 mg total) by mouth every 8 (eight) hours as needed. 20 tablet 5   rizatriptan  (MAXALT ) 10 MG tablet Take 1 tablet (10 mg total) by mouth as needed for migraine. May repeat in 2 hours if needed 10 tablet 5   SUMAtriptan (IMITREX) 100 MG tablet Take 100 mg by mouth as directed. (Patient not taking: Reported on 04/17/2023)     No current facility-administered medications on file prior to visit.    ALLERGIES: Allergies  Allergen Reactions   Lamictal [Lamotrigine]    Sulfa Antibiotics     FAMILY HISTORY: Family History  Problem Relation Age of Onset   Migraines Mother    Hypertension Mother    Ulcerative colitis Father    Drug abuse Maternal Aunt    Drug abuse Maternal Uncle    Alcohol abuse Maternal Grandmother    Alcohol abuse Maternal Grandfather    Heart attack Maternal Grandfather    Alzheimer's disease Paternal Grandmother    Drug abuse Cousin       Objective:  *** General: No acute distress.  Patient appears ***-groomed.   Head:  Normocephalic/atraumatic Eyes:  Fundi examined but not visualized Neck: supple, no paraspinal tenderness, full range of motion Heart:  Regular rate and rhythm Lungs:  Clear to auscultation bilaterally Back: No paraspinal tenderness Neurological Exam: alert and oriented.  Speech fluent and not dysarthric, language intact.  CN II-XII intact. Bulk and tone normal, muscle strength 5/5 throughout.  Sensation to light touch intact.  Deep tendon reflexes 2+ throughout, toes downgoing.  Finger to nose testing intact.  Gait normal, Romberg negative.   Janne Members, DO  CC: ***

## 2023-09-28 ENCOUNTER — Ambulatory Visit: Payer: 59 | Admitting: Neurology

## 2023-11-16 DIAGNOSIS — F411 Generalized anxiety disorder: Secondary | ICD-10-CM | POA: Diagnosis not present

## 2023-11-16 DIAGNOSIS — F4310 Post-traumatic stress disorder, unspecified: Secondary | ICD-10-CM | POA: Diagnosis not present

## 2023-11-16 DIAGNOSIS — F319 Bipolar disorder, unspecified: Secondary | ICD-10-CM | POA: Diagnosis not present
